# Patient Record
Sex: Female | Born: 1955 | Race: White | Hispanic: No | Smoking: Former smoker
Health system: Southern US, Community
[De-identification: ages and names within clinical notes are randomized; demographics above are authoritative.]

## PROBLEM LIST (undated history)

## (undated) DIAGNOSIS — I639 Cerebral infarction, unspecified: Secondary | ICD-10-CM

## (undated) DIAGNOSIS — G43909 Migraine, unspecified, not intractable, without status migrainosus: Secondary | ICD-10-CM

## (undated) DIAGNOSIS — G4700 Insomnia, unspecified: Secondary | ICD-10-CM

## (undated) HISTORY — PX: APPENDECTOMY: SHX54

## (undated) HISTORY — PX: FOOT SURGERY: SHX648

## (undated) HISTORY — DX: Insomnia, unspecified: G47.00

## (undated) HISTORY — PX: VESICOVAGINAL FISTULA CLOSURE W/ TAH: SUR271

## (undated) HISTORY — DX: Cerebral infarction, unspecified: I63.9

## (undated) HISTORY — DX: Migraine, unspecified, not intractable, without status migrainosus: G43.909

## (undated) HISTORY — PX: WISDOM TOOTH EXTRACTION: SHX21

---

## 2011-07-16 ENCOUNTER — Institutional Professional Consult (permissible substitution): Payer: Self-pay | Admitting: Internal Medicine

## 2011-07-17 ENCOUNTER — Other Ambulatory Visit: Payer: Self-pay | Admitting: Critical Care Medicine

## 2011-07-17 ENCOUNTER — Encounter: Payer: Self-pay | Admitting: Critical Care Medicine

## 2011-07-17 ENCOUNTER — Ambulatory Visit (INDEPENDENT_AMBULATORY_CARE_PROVIDER_SITE_OTHER): Payer: Medicare Other | Admitting: Critical Care Medicine

## 2011-07-17 ENCOUNTER — Ambulatory Visit (HOSPITAL_BASED_OUTPATIENT_CLINIC_OR_DEPARTMENT_OTHER)
Admission: RE | Admit: 2011-07-17 | Discharge: 2011-07-17 | Disposition: A | Discharge status: Home / Self Care | Payer: Medicare Other | Source: Ambulatory Visit | Attending: Critical Care Medicine | Admitting: Critical Care Medicine

## 2011-07-17 DIAGNOSIS — J45909 Unspecified asthma, uncomplicated: Secondary | ICD-10-CM

## 2011-07-17 DIAGNOSIS — R059 Cough, unspecified: Secondary | ICD-10-CM | POA: Insufficient documentation

## 2011-07-17 DIAGNOSIS — I6350 Cerebral infarction due to unspecified occlusion or stenosis of unspecified cerebral artery: Secondary | ICD-10-CM

## 2011-07-17 DIAGNOSIS — G43909 Migraine, unspecified, not intractable, without status migrainosus: Secondary | ICD-10-CM | POA: Insufficient documentation

## 2011-07-17 DIAGNOSIS — I639 Cerebral infarction, unspecified: Secondary | ICD-10-CM

## 2011-07-17 DIAGNOSIS — R0602 Shortness of breath: Secondary | ICD-10-CM | POA: Insufficient documentation

## 2011-07-17 DIAGNOSIS — R0789 Other chest pain: Secondary | ICD-10-CM | POA: Insufficient documentation

## 2011-07-17 DIAGNOSIS — R05 Cough: Secondary | ICD-10-CM | POA: Insufficient documentation

## 2011-07-17 MED ORDER — PREDNISONE 10 MG PO TABS: ORAL_TABLET | ORAL | Status: DC

## 2011-07-17 MED ORDER — BUDESONIDE-FORMOTEROL FUMARATE 160-4.5 MCG/ACT IN AERO: 2.0000 | INHALATION_SPRAY | Freq: Two times a day (BID) | RESPIRATORY_TRACT | Status: DC

## 2011-07-17 NOTE — Patient Instructions (Addendum)
Stay off advair Start symbicort two puff twice daily Use combivent or nebulizer medication every 4-6 hours AS NEEDED, NOT ON A SCHEDULE Prednisone 10mg  Take 4 for three days 3 for three days 2 for three days 1 for three days and stop Allergy labs today Chest xray today Alpha one antitrypsin assay today Return 1 week 07/22/11  for recheck with NP Tammy Parrett, then Dr Delford Field in 6 weeks

## 2011-07-17 NOTE — Assessment & Plan Note (Signed)
Severe persistent asthma , ? Component of Copd with prior smoking ?emphysema Note CXR shows no active disease PFTs show moderate obstruction Plan Stay off advair Start symbicort two puff twice daily Use combivent or nebulizer medication every 4-6 hours AS NEEDED, NOT ON A SCHEDULE Prednisone 10mg  Take 4 for three days 3 for three days 2 for three days 1 for three days and stop Allergy labs today Check alpha one anti trypsin assay
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
## 2011-07-17 NOTE — Progress Notes (Signed)
Quick Note:  Notify the patient that the Xray is stable and no pneumonia No change in medications are recommended. Continue current meds as prescribed at last office visit ______ 

## 2011-07-18 ENCOUNTER — Telehealth: Payer: Self-pay | Admitting: Critical Care Medicine

## 2011-07-18 LAB — ALLERGY FULL PROFILE
Allergen, D pternoyssinus,d7: <0.1 kU/L (ref <0.35)
Allergen,Goose feathers, e70: <0.1 kU/L (ref <0.35)
Box Elder IgE: <0.1 kU/L (ref <0.35)
Candida Albicans: <0.1 kU/L (ref <0.35)
Common Ragweed: <0.1 kU/L (ref <0.35)
Curvularia lunata: <0.1 kU/L (ref <0.35)
Fescue: <0.1 kU/L (ref <0.35)
G005 Rye, Perennial: <0.1 kU/L (ref <0.35)
Goldenrod: <0.1 kU/L (ref <0.35)
House Dust Hollister: <0.1 kU/L (ref <0.35)
IgE (Immunoglobulin E), Serum: 9.6 IU/mL (ref 0.0–180.0)
Oak: <0.1 kU/L (ref <0.35)
Stemphylium Botryosum: <0.1 kU/L (ref <0.35)
Timothy Grass: <0.1 kU/L (ref <0.35)

## 2011-07-18 NOTE — Progress Notes (Signed)
Quick Note:  Call pt and tell her labs are ok, No change in medications No allergies seen ______

## 2011-07-18 NOTE — Progress Notes (Signed)
Quick Note:  lmomtcb ______ 

## 2011-07-18 NOTE — Telephone Encounter (Signed)
lmomtcb x1 

## 2011-07-21 NOTE — Telephone Encounter (Signed)
Pt states her nebulizer medication is albuterol-ipratropium and she uses it 4 times a day as needed. Carron Curie, CMA

## 2011-07-21 NOTE — Progress Notes (Signed)
Quick Note:  Called, spoke with pt. I informed her xray is stable and no pna per PW. Advised he recs no change in meds - she should continue them as prescribed at last OV. She verbalized understanding of these results and recs and voiced no further questions/cocnerns at this time. ______

## 2011-07-21 NOTE — Telephone Encounter (Signed)
Medication already on pt's med list as qid prn.

## 2011-07-21 NOTE — Telephone Encounter (Signed)
Crystal Please go into pts chart and document this neb med in med list  pw

## 2011-07-21 NOTE — Progress Notes (Signed)
Quick Note:  Called, spoke with pt. I informed her labs are ok, no allergies seen per Dr. Delford Field and advised he recs no change in her meds. She verbalized understanding of these results and recs and voiced no further questions/cocnerns at this time. ______

## 2011-07-28 ENCOUNTER — Encounter: Payer: Self-pay | Admitting: Critical Care Medicine

## 2011-07-28 ENCOUNTER — Ambulatory Visit (INDEPENDENT_AMBULATORY_CARE_PROVIDER_SITE_OTHER): Payer: Medicare Other | Admitting: Critical Care Medicine

## 2011-07-28 DIAGNOSIS — J45909 Unspecified asthma, uncomplicated: Secondary | ICD-10-CM

## 2011-07-28 NOTE — Progress Notes (Signed)
Subjective:    Patient ID: Tiffany Mcclure, female    DOB: 09/22/1955, 56 y.o.   MRN: [id number]  HPI 07/28/2011 No real change.  Has a lesion on the lip.  No smoking.  Still has pndrip. Still with dry cough.  Notes some wheezing if walking.  Pt cont to use the symbicort. HFA technique poor   Past Medical History  Diagnosis Date  . Asthma   . Migraines   . Insomnia   . Stroke      Family History  Problem Relation Age of Onset  . Emphysema Father   . Emphysema Brother   . Emphysema Paternal Grandmother   . Emphysema Paternal Grandfather   . Asthma Father   . Heart attack Mother   . Heart attack Father   . Heart attack Brother   . Heart disease Father   . Cancer      maternal great grandmother, type unknown  . Cancer Paternal Aunt     type unknown     History   Social History  . Marital Status: Divorced    Spouse Name: N/A    Number of Children: 3  . Years of Education: N/A   Occupational History  . disability     cotton mill/sewing   Social History Main Topics  . Smoking status: Former Smoker -- 2.0 packs/day for 6 years    Types: Cigarettes    Quit date: 07/16/1996  . Smokeless tobacco: Never Used  . Alcohol Use: No  . Drug Use: No  . Sexually Active: Not on file   Other Topics Concern  . Not on file   Social History Narrative  . No narrative on file     No Known Allergies   Outpatient Prescriptions Prior to Visit  Medication Sig Dispense Refill  . ALPRAZolam (XANAX) 0.5 MG tablet Take 0.5 mg by mouth. Take 1/2 to 1 tablet twice a day as needed for anxiety      . benzonatate (TESSALON) 200 MG capsule Take 200 mg by mouth 2 (two) times daily as needed.      . budesonide-formoterol (SYMBICORT) 160-4.5 MCG/ACT inhaler Inhale 2 puffs into the lungs 2 (two) times daily.  1 Inhaler  12  . Cetirizine HCl 10 MG CAPS Take by mouth as needed.      . clopidogrel (PLAVIX) 75 MG tablet Take 75 mg by mouth daily.      . DULoxetine (CYMBALTA) 60 MG capsule Take  60 mg by mouth daily.      . nabumetone (RELAFEN) 750 MG tablet Take 750 mg by mouth 2 (two) times daily as needed.      Marland Kitchen oxyCODONE-acetaminophen (PERCOCET) 5-325 MG per tablet Take 1 tablet by mouth. Take 1 tablet 3 times a day as needed      . pantoprazole (PROTONIX) 40 MG tablet Take 40 mg by mouth daily.      . rosuvastatin (CRESTOR) 20 MG tablet Take 20 mg by mouth daily.      Marland Kitchen topiramate (TOPAMAX) 200 MG tablet Take 200 mg by mouth 2 (two) times daily.      Marland Kitchen zolpidem (AMBIEN) 10 MG tablet Take 10 mg by mouth at bedtime as needed.      Marland Kitchen ipratropium-albuterol (DUONEB) 0.5-2.5 (3) MG/3ML SOLN Take 3 mLs by nebulization every 4 (four) hours as needed.      Marland Kitchen albuterol (PROVENTIL) (2.5 MG/3ML) 0.083% nebulizer solution Take 2.5 mg by nebulization every 4 (four) hours as needed.      Marland Kitchen  albuterol-ipratropium (COMBIVENT) 18-103 MCG/ACT inhaler Inhale 2 puffs into the lungs every 6 (six) hours as needed.      . predniSONE (DELTASONE) 10 MG tablet Take 4 for three days 3 for three days 2 for three days 1 for three days and stop  30 tablet  0      Review of Systems  Constitutional: Negative for chills, diaphoresis, activity change, fatigue and unexpected weight change.  HENT: Positive for mouth sores and neck pain. Negative for hearing loss, nosebleeds, congestion, facial swelling, neck stiffness, dental problem, voice change, sinus pressure, tinnitus and ear discharge.   Eyes: Negative for photophobia, discharge, itching and visual disturbance.  Respiratory: Negative for apnea, choking, chest tightness and stridor.   Cardiovascular: Negative for palpitations and leg swelling.  Gastrointestinal: Negative for nausea, vomiting, abdominal pain, constipation, blood in stool and abdominal distention.  Genitourinary: Negative for dysuria, urgency, frequency, hematuria, flank pain, decreased urine volume and difficulty urinating.  Musculoskeletal: Positive for back pain. Negative for joint swelling,  arthralgias and gait problem.  Skin: Negative for color change, pallor and rash.  Neurological: Negative for dizziness, tremors, seizures, syncope, speech difficulty, weakness, light-headedness and numbness.  Hematological: Negative for adenopathy. Bruises/bleeds easily.  Psychiatric/Behavioral: Positive for sleep disturbance. Negative for confusion and agitation. The patient is nervous/anxious.        Objective:   Physical Exam   Filed Vitals:   07/28/11 0919  BP: 118/80  Pulse: 63  Temp: 97.5 F (36.4 C)  TempSrc: Oral  Height: 5\' 3"  (1.6 m)  Weight: 143 lb (64.864 kg)  SpO2: 100%    Gen: Pleasant, well-nourished, in no distress,  normal affect  ENT: No lesions,  mouth clear,  oropharynx clear, no postnasal drip  Neck: No JVD, no TMG, no carotid bruits  Lungs: No use of accessory muscles, no dullness to percussion, distant BS  Cardiovascular: RRR, heart sounds normal, no murmur or gallops, no peripheral edema  Abdomen: soft and NT, no HSM,  BS normal  Musculoskeletal: No deformities, no cyanosis or clubbing  Neuro: alert, non focal  Skin: Warm, no lesions or rashes  Cleda Daub FeV1 70%  07/17/11      Assessment & Plan:   Asthma Severe persistent asthma , note poor HFA technique, pt retrained to up to 50% efficiency Note CXR shows no active disease PFTs show moderate obstruction Plan Cont symbicort two puff twice daily, work on [employer] or nebulizer medication every 4-6 hours AS NEEDED, NOT ON A SCHEDULE F/u alpha one level when available      Updated Medication List Outpatient Encounter Prescriptions as of 07/28/2011  Medication Sig Dispense Refill  . ALPRAZolam (XANAX) 0.5 MG tablet Take 0.5 mg by mouth. Take 1/2 to 1 tablet twice a day as needed for anxiety      . benzonatate (TESSALON) 200 MG capsule Take 200 mg by mouth 2 (two) times daily as needed.      . budesonide-formoterol (SYMBICORT) 160-4.5 MCG/ACT inhaler Inhale 2 puffs into  the lungs 2 (two) times daily.  1 Inhaler  12  . Cetirizine HCl 10 MG CAPS Take by mouth as needed.      . clopidogrel (PLAVIX) 75 MG tablet Take 75 mg by mouth daily.      . DULoxetine (CYMBALTA) 60 MG capsule Take 60 mg by mouth daily.      . nabumetone (RELAFEN) 750 MG tablet Take 750 mg by mouth 2 (two) times daily as needed.      Marland Kitchen  oxyCODONE-acetaminophen (PERCOCET) 5-325 MG per tablet Take 1 tablet by mouth. Take 1 tablet 3 times a day as needed      . pantoprazole (PROTONIX) 40 MG tablet Take 40 mg by mouth daily.      . rosuvastatin (CRESTOR) 20 MG tablet Take 20 mg by mouth daily.      Marland Kitchen topiramate (TOPAMAX) 200 MG tablet Take 200 mg by mouth 2 (two) times daily.      Marland Kitchen zolpidem (AMBIEN) 10 MG tablet Take 10 mg by mouth at bedtime as needed.      Marland Kitchen DISCONTD: ipratropium-albuterol (DUONEB) 0.5-2.5 (3) MG/3ML SOLN Take 3 mLs by nebulization every 4 (four) hours as needed.      Marland Kitchen albuterol (PROVENTIL) (2.5 MG/3ML) 0.083% nebulizer solution Take 2.5 mg by nebulization every 4 (four) hours as needed.      Marland Kitchen albuterol-ipratropium (COMBIVENT) 18-103 MCG/ACT inhaler Inhale 2 puffs into the lungs every 6 (six) hours as needed.      Marland Kitchen DISCONTD: predniSONE (DELTASONE) 10 MG tablet Take 4 for three days 3 for three days 2 for three days 1 for three days and stop  30 tablet  0

## 2011-07-28 NOTE — Patient Instructions (Signed)
Work on inhaler technique No medication changes Return one month

## 2011-07-28 NOTE — Assessment & Plan Note (Addendum)
Severe persistent asthma , note poor HFA technique, pt retrained to up to 50% efficiency Note CXR shows no active disease PFTs show moderate obstruction Plan Cont symbicort two puff twice daily, work on [employer] or nebulizer medication every 4-6 hours AS NEEDED, NOT ON A SCHEDULE F/u alpha one level when available

## 2011-08-07 ENCOUNTER — Telehealth: Payer: Self-pay | Admitting: *Deleted

## 2011-08-07 NOTE — Telephone Encounter (Signed)
Called, spoke with pt.  I informed her alpha 1 test results are normal.  She verbalized understanding of this and voiced no further questions/concerns at this time.

## 2011-08-14 ENCOUNTER — Encounter: Payer: Self-pay | Admitting: Critical Care Medicine

## 2011-08-28 ENCOUNTER — Ambulatory Visit: Payer: Medicare Other | Admitting: Critical Care Medicine

## 2011-09-18 ENCOUNTER — Ambulatory Visit (INDEPENDENT_AMBULATORY_CARE_PROVIDER_SITE_OTHER): Payer: Medicare Other | Admitting: Critical Care Medicine

## 2011-09-18 ENCOUNTER — Encounter: Payer: Self-pay | Admitting: Critical Care Medicine

## 2011-09-18 DIAGNOSIS — J45909 Unspecified asthma, uncomplicated: Secondary | ICD-10-CM

## 2011-09-18 NOTE — Progress Notes (Signed)
Subjective:    Patient ID: Tiffany Mcclure, female    DOB: May 11, 1956, 56 y.o.   MRN: [id number]  HPI  07/28/2011 No real change.  Has a lesion on the lip.  No smoking.  Still has pndrip. Still with dry cough.  Notes some wheezing if walking.  Pt cont to use the symbicort. HFA technique poor   09/18/2011 At last ov we rec Cont symbicort two puff twice daily, work on [employer] or nebulizer medication every 4-6 hours AS NEEDED, NOT ON A SCHEDULE F/u alpha one level when available  09/18/2011 Larey Seat last week and injured shoulder.  Now no real cough.  Wheezing is better.  On symbicort HFA and technique is better.  Facial issue is better. A1AT normal.    Past Medical History  Diagnosis Date  . Asthma   . Migraines   . Insomnia   . Stroke      Family History  Problem Relation Age of Onset  . Emphysema Father   . Emphysema Brother   . Emphysema Paternal Grandmother   . Emphysema Paternal Grandfather   . Asthma Father   . Heart attack Mother   . Heart attack Father   . Heart attack Brother   . Heart disease Father   . Cancer      maternal great grandmother, type unknown  . Cancer Paternal Aunt     type unknown     History   Social History  . Marital Status: Divorced    Spouse Name: N/A    Number of Children: 3  . Years of Education: N/A   Occupational History  . disability     cotton mill/sewing   Social History Main Topics  . Smoking status: Former Smoker -- 2.0 packs/day for 6 years    Types: Cigarettes    Quit date: 07/16/1996  . Smokeless tobacco: Never Used  . Alcohol Use: No  . Drug Use: No  . Sexually Active: Not on file   Other Topics Concern  . Not on file   Social History Narrative  . No narrative on file     No Known Allergies   Outpatient Prescriptions Prior to Visit  Medication Sig Dispense Refill  . albuterol (PROVENTIL) (2.5 MG/3ML) 0.083% nebulizer solution Take 2.5 mg by nebulization every 4 (four) hours as needed.        Marland Kitchen albuterol-ipratropium (COMBIVENT) 18-103 MCG/ACT inhaler Inhale 2 puffs into the lungs every 6 (six) hours as needed.      . ALPRAZolam (XANAX) 0.5 MG tablet Take 0.5 mg by mouth. Take 1/2 to 1 tablet twice a day as needed for anxiety      . benzonatate (TESSALON) 200 MG capsule Take 200 mg by mouth 2 (two) times daily as needed.      . budesonide-formoterol (SYMBICORT) 160-4.5 MCG/ACT inhaler Inhale 2 puffs into the lungs 2 (two) times daily.  1 Inhaler  12  . Cetirizine HCl 10 MG CAPS Take by mouth as needed.      . clopidogrel (PLAVIX) 75 MG tablet Take 75 mg by mouth daily.      . DULoxetine (CYMBALTA) 60 MG capsule Take 60 mg by mouth daily.      . nabumetone (RELAFEN) 750 MG tablet Take 750 mg by mouth 2 (two) times daily as needed.      Marland Kitchen oxyCODONE-acetaminophen (PERCOCET) 5-325 MG per tablet Take 1 tablet by mouth. Take 1 tablet 3 times a day as needed      .  pantoprazole (PROTONIX) 40 MG tablet Take 40 mg by mouth daily.      . rosuvastatin (CRESTOR) 20 MG tablet Take 20 mg by mouth daily.      Marland Kitchen topiramate (TOPAMAX) 200 MG tablet Take 200 mg by mouth 2 (two) times daily.      Marland Kitchen zolpidem (AMBIEN) 10 MG tablet Take 10 mg by mouth at bedtime as needed.          Review of Systems  Constitutional: Negative for chills, diaphoresis, activity change, fatigue and unexpected weight change.  HENT: Positive for mouth sores and neck pain. Negative for hearing loss, nosebleeds, congestion, facial swelling, neck stiffness, dental problem, voice change, sinus pressure, tinnitus and ear discharge.   Eyes: Negative for photophobia, discharge, itching and visual disturbance.  Respiratory: Negative for apnea, choking, chest tightness and stridor.   Cardiovascular: Negative for palpitations and leg swelling.  Gastrointestinal: Negative for nausea, vomiting, abdominal pain, constipation, blood in stool and abdominal distention.  Genitourinary: Negative for dysuria, urgency, frequency, hematuria,  flank pain, decreased urine volume and difficulty urinating.  Musculoskeletal: Positive for back pain. Negative for joint swelling, arthralgias and gait problem.  Skin: Negative for color change, pallor and rash.  Neurological: Negative for dizziness, tremors, seizures, syncope, speech difficulty, weakness, light-headedness and numbness.  Hematological: Negative for adenopathy. Bruises/bleeds easily.  Psychiatric/Behavioral: Positive for sleep disturbance. Negative for confusion and agitation. The patient is nervous/anxious.        Objective:   Physical Exam   Filed Vitals:   09/18/11 1336  BP: 126/76  Pulse: 72  Temp: 97.7 F (36.5 C)  TempSrc: Oral  Height: 5\' 3"  (1.6 m)  Weight: 144 lb 8 oz (65.545 kg)  SpO2: 100%    Gen: Pleasant, well-nourished, in no distress,  normal affect  ENT: No lesions,  mouth clear,  oropharynx clear, no postnasal drip  Neck: No JVD, no TMG, no carotid bruits  Lungs: No use of accessory muscles, no dullness to percussion, distant BS  Cardiovascular: RRR, heart sounds normal, no murmur or gallops, no peripheral edema  Abdomen: soft and NT, no HSM,  BS normal  Musculoskeletal: No deformities, no cyanosis or clubbing  Neuro: alert, non focal  Skin: Warm, no lesions or rashes        Assessment & Plan:   Asthma Moderate persistent asthma with improvement following Symbicort therapy Note normal alpha 1 antitrypsin assay and no evidence of emphysema on chest x-ray Plan  Maintain Symbicort 2 inhalations twice daily Continue antihistamine as needed Return 6 months     Updated Medication List Outpatient Encounter Prescriptions as of 09/18/2011  Medication Sig Dispense Refill  . albuterol (PROVENTIL) (2.5 MG/3ML) 0.083% nebulizer solution Take 2.5 mg by nebulization every 4 (four) hours as needed.      Marland Kitchen albuterol-ipratropium (COMBIVENT) 18-103 MCG/ACT inhaler Inhale 2 puffs into the lungs every 6 (six) hours as needed.      .  ALPRAZolam (XANAX) 0.5 MG tablet Take 0.5 mg by mouth. Take 1/2 to 1 tablet twice a day as needed for anxiety      . benzonatate (TESSALON) 200 MG capsule Take 200 mg by mouth 2 (two) times daily as needed.      . budesonide-formoterol (SYMBICORT) 160-4.5 MCG/ACT inhaler Inhale 2 puffs into the lungs 2 (two) times daily.  1 Inhaler  12  . Cetirizine HCl 10 MG CAPS Take by mouth as needed.      . clopidogrel (PLAVIX) 75 MG tablet Take 75 mg by  mouth daily.      . DULoxetine (CYMBALTA) 60 MG capsule Take 60 mg by mouth daily.      . nabumetone (RELAFEN) 750 MG tablet Take 750 mg by mouth 2 (two) times daily as needed.      Marland Kitchen oxyCODONE-acetaminophen (PERCOCET) 5-325 MG per tablet Take 1 tablet by mouth. Take 1 tablet 3 times a day as needed      . pantoprazole (PROTONIX) 40 MG tablet Take 40 mg by mouth daily.      . rosuvastatin (CRESTOR) 20 MG tablet Take 20 mg by mouth daily.      Marland Kitchen topiramate (TOPAMAX) 200 MG tablet Take 200 mg by mouth 2 (two) times daily.      Marland Kitchen zolpidem (AMBIEN) 10 MG tablet Take 10 mg by mouth at bedtime as needed.

## 2011-09-18 NOTE — Patient Instructions (Signed)
Stay on symbicort two puff twice daily Return 6 months  

## 2011-09-18 NOTE — Assessment & Plan Note (Addendum)
Moderate persistent asthma with improvement following Symbicort therapy Note normal alpha 1 antitrypsin assay and no evidence of emphysema on chest x-ray Plan  Maintain Symbicort 2 inhalations twice daily Continue antihistamine as needed Return 6 months

## 2012-03-25 ENCOUNTER — Ambulatory Visit (INDEPENDENT_AMBULATORY_CARE_PROVIDER_SITE_OTHER): Payer: Medicare Other | Admitting: Critical Care Medicine

## 2012-03-25 ENCOUNTER — Encounter: Payer: Self-pay | Admitting: Critical Care Medicine

## 2012-03-25 ENCOUNTER — Ambulatory Visit: Payer: Medicare Other | Admitting: Critical Care Medicine

## 2012-03-25 VITALS — BP 128/60 | HR 64 | Temp 97.8°F | Ht 63.0 in | Wt 141.0 lb

## 2012-03-25 DIAGNOSIS — J45909 Unspecified asthma, uncomplicated: Secondary | ICD-10-CM

## 2012-03-25 MED ORDER — BUDESONIDE 180 MCG/ACT IN AEPB: INHALATION_SPRAY | RESPIRATORY_TRACT | Status: DC

## 2012-03-25 NOTE — Patient Instructions (Addendum)
Stop symbicort  Start Pulmicort two puff twice a day for 7days then reduce to two puff daily and stay Use combivent as needed Return 2 months

## 2012-03-25 NOTE — Progress Notes (Signed)
Subjective:    Patient ID: Tiffany Mcclure, female    DOB: 1956/03/22, 56 y.o.   MRN: [id number]  HPI  03/25/2012 SInce last ov is better.  No cough no wheeze.  Uses rescue inhaler only if around strong fumes/perfume.   Pt denies any significant sore throat, nasal congestion or excess secretions, fever, chills, sweats, unintended weight loss, pleurtic or exertional chest pain, orthopnea PND, or leg swelling Pt denies any increase in rescue therapy over baseline, denies waking up needing it or having any early am or nocturnal exacerbations of coughing/wheezing/or dyspnea. Pt also denies any obvious fluctuation in symptoms with  weather or environmental change or other alleviating or aggravating factors  Notes some pndrip   Past Medical History  Diagnosis Date  . Asthma   . Migraines   . Insomnia   . Stroke      Family History  Problem Relation Age of Onset  . Emphysema Father   . Emphysema Brother   . Emphysema Paternal Grandmother   . Emphysema Paternal Grandfather   . Asthma Father   . Heart attack Mother   . Heart attack Father   . Heart attack Brother   . Heart disease Father   . Cancer      maternal great grandmother, type unknown  . Cancer Paternal Aunt     type unknown     History   Social History  . Marital Status: Divorced    Spouse Name: N/A    Number of Children: 3  . Years of Education: N/A   Occupational History  . disability     cotton mill/sewing   Social History Main Topics  . Smoking status: Former Smoker -- 2.0 packs/day for 6 years    Types: Cigarettes    Quit date: 07/16/1996  . Smokeless tobacco: Never Used  . Alcohol Use: No  . Drug Use: No  . Sexually Active: Not on file   Other Topics Concern  . Not on file   Social History Narrative  . No narrative on file     No Known Allergies   Outpatient Prescriptions Prior to Visit  Medication Sig Dispense Refill  . albuterol (PROVENTIL) (2.5 MG/3ML) 0.083% nebulizer solution Take  2.5 mg by nebulization every 4 (four) hours as needed.      Marland Kitchen albuterol-ipratropium (COMBIVENT) 18-103 MCG/ACT inhaler Inhale 2 puffs into the lungs every 6 (six) hours as needed.      . ALPRAZolam (XANAX) 0.5 MG tablet Take 0.5 mg by mouth. Take 1/2 to 1 tablet twice a day as needed for anxiety      . Cetirizine HCl 10 MG CAPS Take 1 capsule by mouth daily as needed.       . clopidogrel (PLAVIX) 75 MG tablet Take 75 mg by mouth daily.      . DULoxetine (CYMBALTA) 60 MG capsule Take 60 mg by mouth daily.      . nabumetone (RELAFEN) 750 MG tablet Take 750 mg by mouth 2 (two) times daily.       Marland Kitchen oxyCODONE-acetaminophen (PERCOCET) 5-325 MG per tablet Take 1 tablet by mouth. Take 1 tablet 3 times a day as needed      . pantoprazole (PROTONIX) 40 MG tablet Take 40 mg by mouth daily.      . rosuvastatin (CRESTOR) 20 MG tablet Take 20 mg by mouth daily.      Marland Kitchen topiramate (TOPAMAX) 200 MG tablet Take 200 mg by mouth 2 (two) times daily.      Marland Kitchen  zolpidem (AMBIEN) 10 MG tablet Take 5 mg by mouth at bedtime.       . budesonide-formoterol (SYMBICORT) 160-4.5 MCG/ACT inhaler Inhale 2 puffs into the lungs 2 (two) times daily.  1 Inhaler  12  . benzonatate (TESSALON) 200 MG capsule Take 200 mg by mouth 2 (two) times daily as needed.          Review of Systems  Constitutional: Negative for chills, diaphoresis, activity change, fatigue and unexpected weight change.  HENT: Positive for mouth sores and neck pain. Negative for hearing loss, nosebleeds, congestion, facial swelling, neck stiffness, dental problem, voice change, sinus pressure, tinnitus and ear discharge.   Eyes: Negative for photophobia, discharge, itching and visual disturbance.  Respiratory: Negative for apnea, choking, chest tightness and stridor.   Cardiovascular: Negative for palpitations and leg swelling.  Gastrointestinal: Negative for nausea, vomiting, abdominal pain, constipation, blood in stool and abdominal distention.  Genitourinary:  Negative for dysuria, urgency, frequency, hematuria, flank pain, decreased urine volume and difficulty urinating.  Musculoskeletal: Positive for back pain. Negative for joint swelling, arthralgias and gait problem.  Skin: Negative for color change, pallor and rash.  Neurological: Negative for dizziness, tremors, seizures, syncope, speech difficulty, weakness, light-headedness and numbness.  Hematological: Negative for adenopathy. Bruises/bleeds easily.  Psychiatric/Behavioral: Positive for sleep disturbance. Negative for confusion and agitation. The patient is nervous/anxious.        Objective:   Physical Exam   Filed Vitals:   03/25/12 1404  BP: 128/60  Pulse: 64  Temp: 97.8 F (36.6 C)  TempSrc: Oral  Height: 5\' 3"  (1.6 m)  Weight: 141 lb (63.957 kg)  SpO2: 100%    Gen: Pleasant, well-nourished, in no distress,  normal affect  ENT: No lesions,  mouth clear,  oropharynx clear, no postnasal drip  Neck: No JVD, no TMG, no carotid bruits  Lungs: No use of accessory muscles, no dullness to percussion, distant BS  Cardiovascular: RRR, heart sounds normal, no murmur or gallops, no peripheral edema  Abdomen: soft and NT, no HSM,  BS normal  Musculoskeletal: No deformities, no cyanosis or clubbing  Neuro: alert, non focal  Skin: Warm, no lesions or rashes        Assessment & Plan:   Moderate persistent asthma Moderate persistent asthma with stable on Laba/ics Plan Trial off laba/ics  Start ICS alone: pulmicort two puff bid x 7days then two puff daily thereafter    Updated Medication List Outpatient Encounter Prescriptions as of 03/25/2012  Medication Sig Dispense Refill  . albuterol (PROVENTIL) (2.5 MG/3ML) 0.083% nebulizer solution Take 2.5 mg by nebulization every 4 (four) hours as needed.      Marland Kitchen albuterol-ipratropium (COMBIVENT) 18-103 MCG/ACT inhaler Inhale 2 puffs into the lungs every 6 (six) hours as needed.      . ALPRAZolam (XANAX) 0.5 MG tablet Take 0.5  mg by mouth. Take 1/2 to 1 tablet twice a day as needed for anxiety      . Cetirizine HCl 10 MG CAPS Take 1 capsule by mouth daily as needed.       . clopidogrel (PLAVIX) 75 MG tablet Take 75 mg by mouth daily.      . DULoxetine (CYMBALTA) 60 MG capsule Take 60 mg by mouth daily.      . nabumetone (RELAFEN) 750 MG tablet Take 750 mg by mouth 2 (two) times daily.       Marland Kitchen oxyCODONE-acetaminophen (PERCOCET) 5-325 MG per tablet Take 1 tablet by mouth. Take 1 tablet 3 times a  day as needed      . pantoprazole (PROTONIX) 40 MG tablet Take 40 mg by mouth daily.      . rosuvastatin (CRESTOR) 20 MG tablet Take 20 mg by mouth daily.      Marland Kitchen topiramate (TOPAMAX) 200 MG tablet Take 200 mg by mouth 2 (two) times daily.      Marland Kitchen zolpidem (AMBIEN) 10 MG tablet Take 5 mg by mouth at bedtime.       . [DISCONTINUED] budesonide-formoterol (SYMBICORT) 160-4.5 MCG/ACT inhaler Inhale 2 puffs into the lungs 2 (two) times daily.  1 Inhaler  12  . [DISCONTINUED] budesonide-formoterol (SYMBICORT) 160-4.5 MCG/ACT inhaler Inhale 1 puff into the lungs daily.      . budesonide (PULMICORT FLEXHALER) 180 MCG/ACT inhaler Two puff twice a day for 7 days then two puff daily  1 each  6  . [DISCONTINUED] benzonatate (TESSALON) 200 MG capsule Take 200 mg by mouth 2 (two) times daily as needed.

## 2012-03-25 NOTE — Assessment & Plan Note (Signed)
Moderate persistent asthma with stable on Laba/ics Plan Trial off laba/ics  Start ICS alone: pulmicort two puff bid x 7days then two puff daily thereafter

## 2012-03-26 ENCOUNTER — Telehealth: Payer: Self-pay | Admitting: Critical Care Medicine

## 2012-03-26 NOTE — Telephone Encounter (Signed)
Member ID # [id number]. Pul,icort Flexhaler APPROVED from 03/25/12 through 05/18/2013. Pharmacy notified.

## 2012-05-20 ENCOUNTER — Telehealth: Payer: Self-pay | Admitting: Critical Care Medicine

## 2012-05-20 NOTE — Telephone Encounter (Signed)
Called and spoke with pt on 05/20/12 to make next ov per recall.  Pt did not wish to make this appt at this time.  She stated she will call back and schedule @ a later date. Leanora Ivanoff

## 2013-01-16 IMAGING — CR DG CHEST 2V
2 series · 2 of 2 positions shown · non-contrast
Comparison: None.

CLINICAL DATA: Cough, shortness of breath and chest tightness.

CHEST - 2 VIEW

[w chest pa]
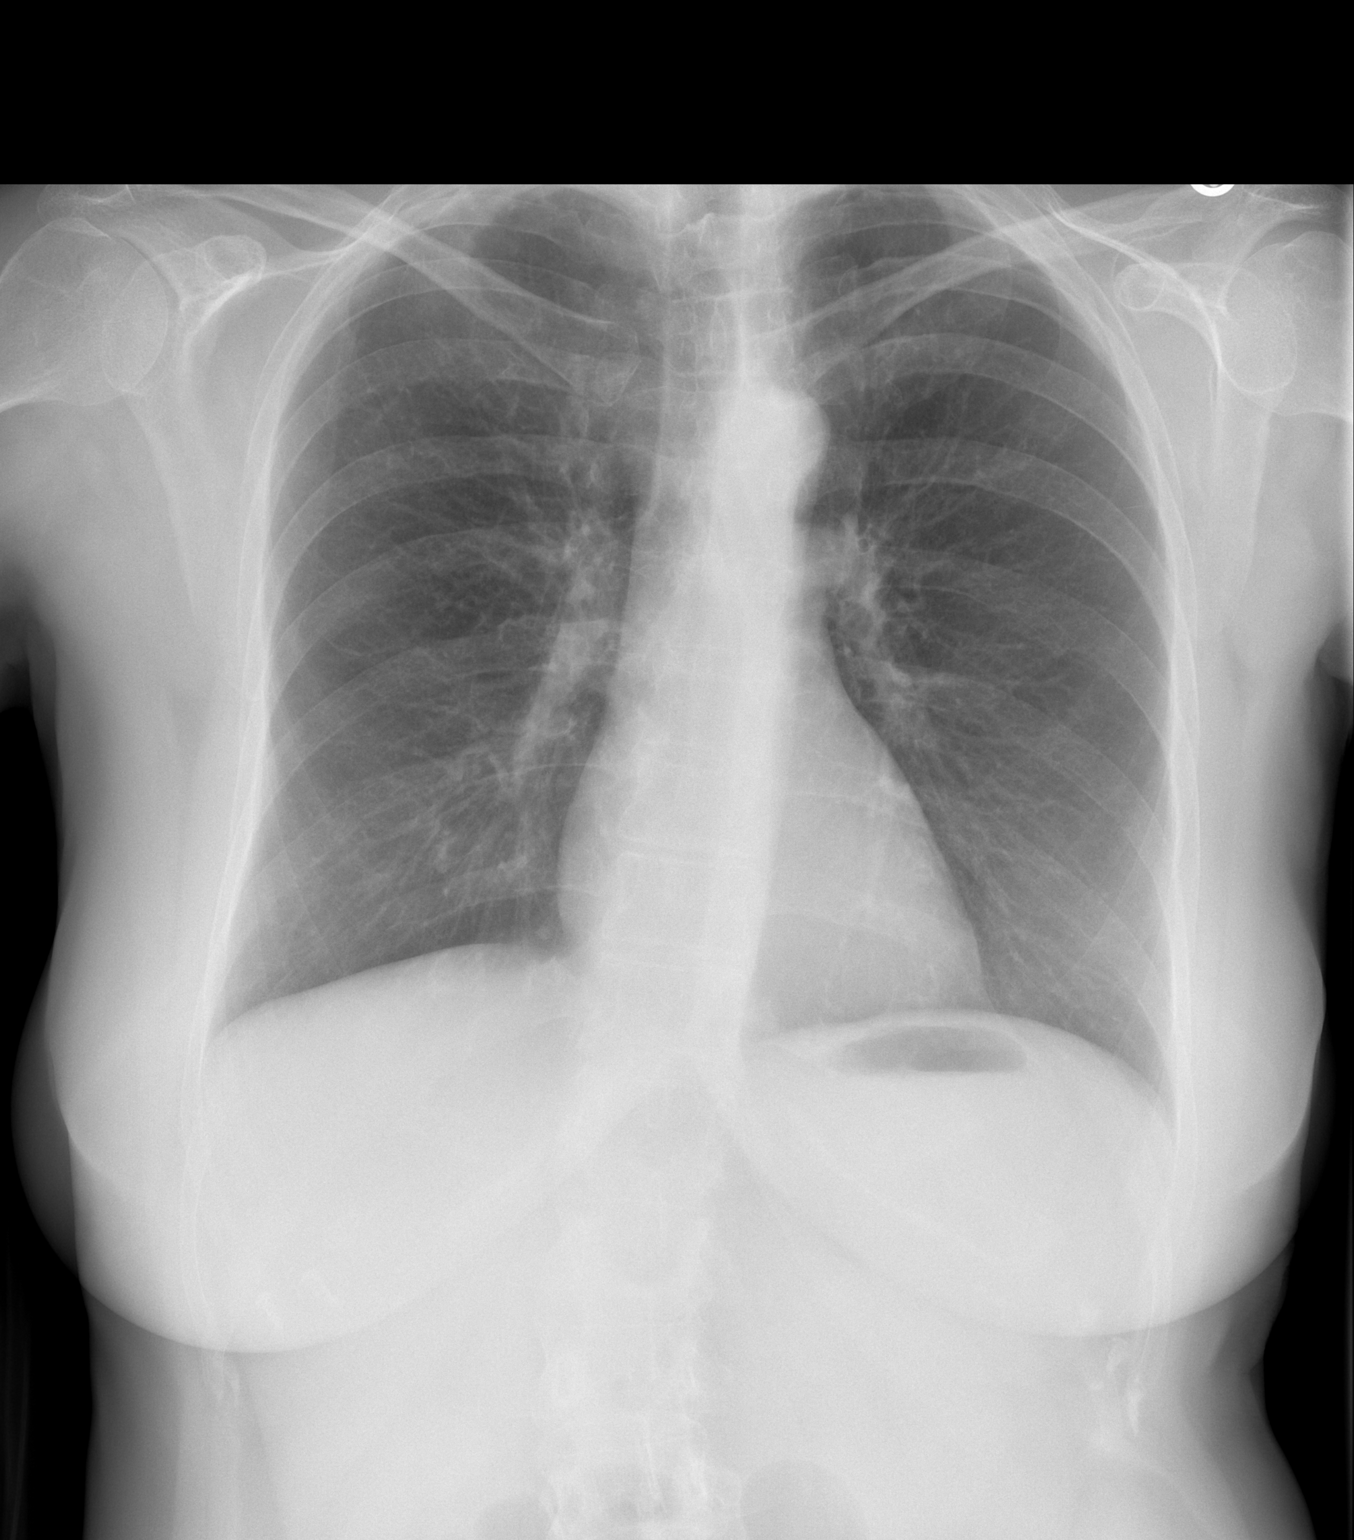

[w chest lat]
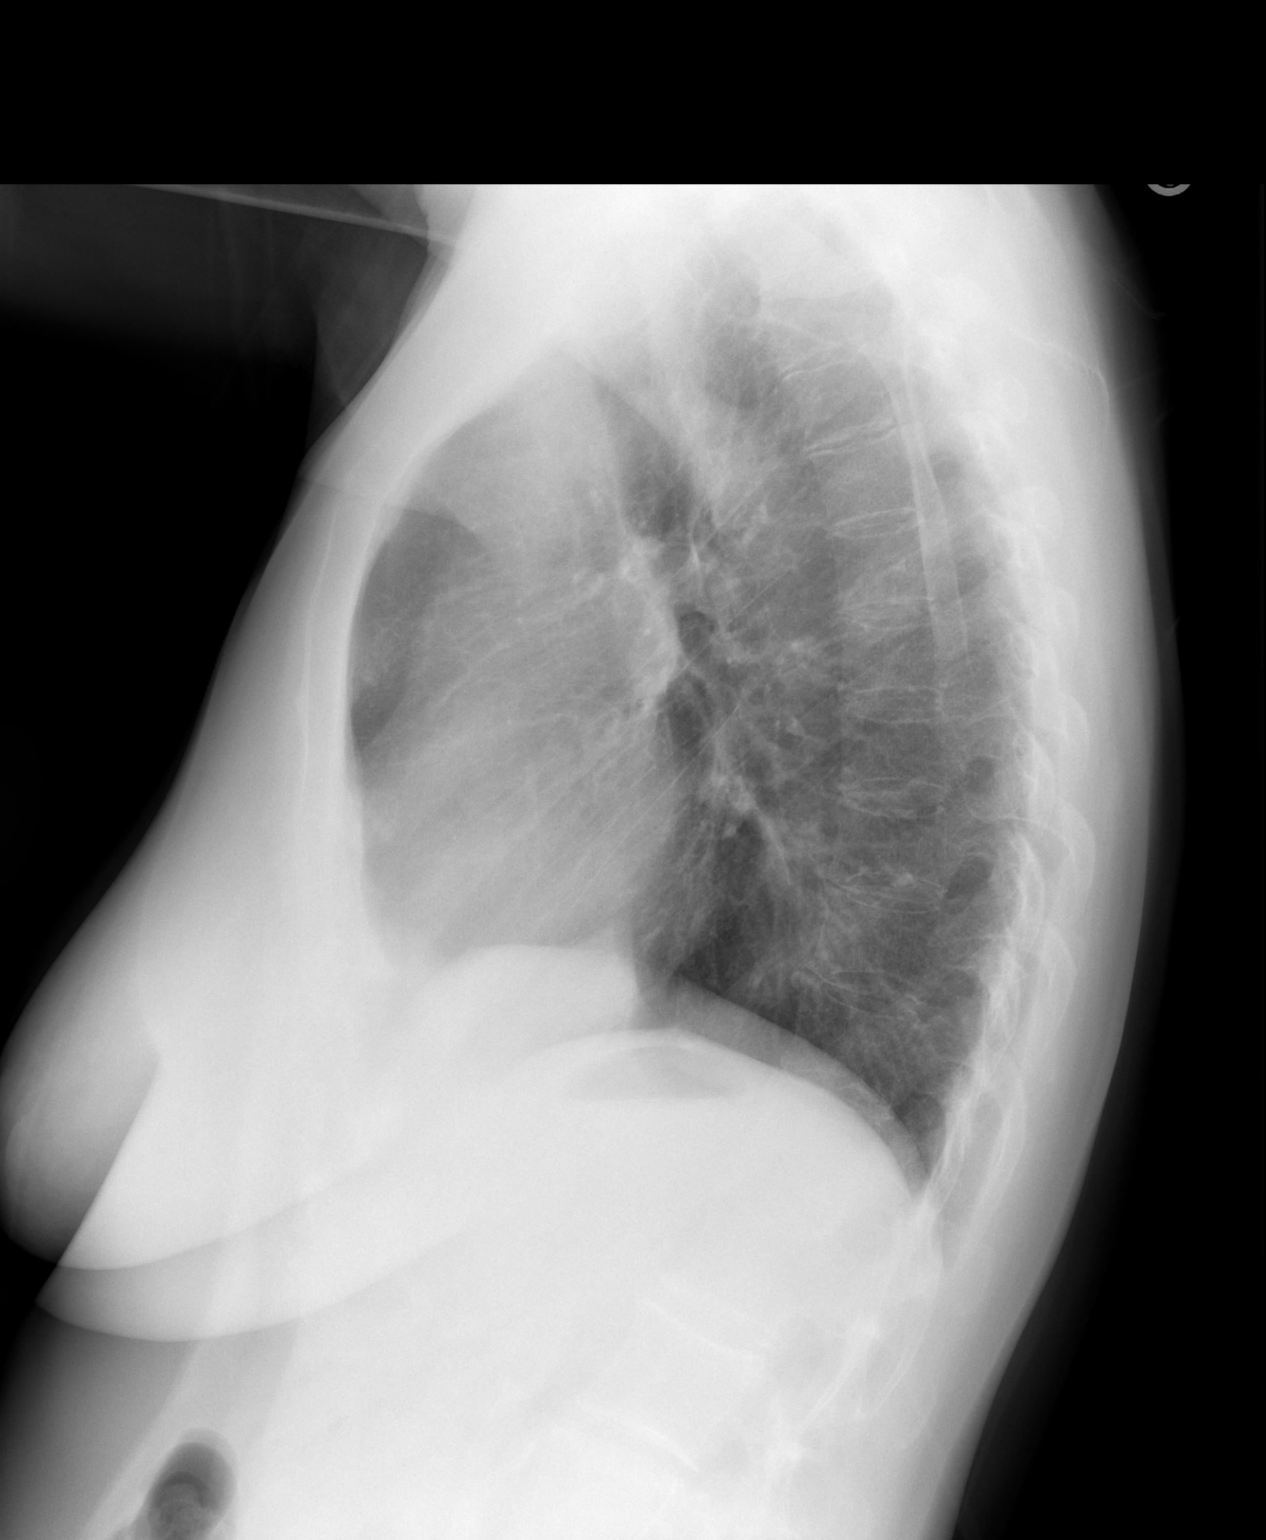

[2 of 2 positions shown; findings below may reference images not displayed]

FINDINGS: Trachea is midline.  Heart size normal.  Biapical pleural
parenchymal scarring.  Lungs are otherwise clear.  No pleural
fluid.
IMPRESSION: No acute findings.

## 2015-02-06 DIAGNOSIS — J45909 Unspecified asthma, uncomplicated: Secondary | ICD-10-CM | POA: Insufficient documentation

## 2015-02-06 DIAGNOSIS — K219 Gastro-esophageal reflux disease without esophagitis: Secondary | ICD-10-CM | POA: Insufficient documentation

## 2015-02-06 DIAGNOSIS — J309 Allergic rhinitis, unspecified: Secondary | ICD-10-CM | POA: Insufficient documentation

## 2015-03-26 ENCOUNTER — Other Ambulatory Visit: Payer: Self-pay | Admitting: *Deleted

## 2015-03-26 ENCOUNTER — Telehealth: Payer: Self-pay | Admitting: *Deleted

## 2015-03-26 MED ORDER — OMEPRAZOLE 40 MG PO CPDR: 40.0000 mg | DELAYED_RELEASE_CAPSULE | Freq: Two times a day (BID) | ORAL | Status: DC

## 2015-03-26 NOTE — Telephone Encounter (Signed)
PT CALLED REQUESTING PRESCRIPTION FOR OMEPRAZOLE 40MG .

## 2015-03-26 NOTE — Telephone Encounter (Signed)
RX SENT LM ADVISNG PT OF SAME

## 2015-06-20 ENCOUNTER — Other Ambulatory Visit: Payer: Self-pay | Admitting: *Deleted

## 2015-06-20 MED ORDER — BUDESONIDE-FORMOTEROL FUMARATE 160-4.5 MCG/ACT IN AERO: 2.0000 | INHALATION_SPRAY | Freq: Two times a day (BID) | RESPIRATORY_TRACT | Status: DC

## 2015-06-20 NOTE — Telephone Encounter (Signed)
CALLED PATIENT AND ADVISED THAT DULERA NOT ON PART D FORMULARY. WILL SEND [facility] DRUG TO [facility] PER DR KOZLOW RX SYMBICORT 160 2 BID SHE WILL TRY ON MONTHLY RX INSTEAD OF 90 DAY

## 2015-07-03 ENCOUNTER — Other Ambulatory Visit: Payer: Self-pay | Admitting: Allergy and Immunology

## 2015-07-03 NOTE — Telephone Encounter (Signed)
Please contact patient and find out what the situation is concerning her respiratory problems and her medications.

## 2015-07-03 NOTE — Telephone Encounter (Signed)
Please advise 

## 2015-07-03 NOTE — Telephone Encounter (Signed)
SYMBICORT is not working. Needs to try something else.  Tiffany Mcclure

## 2015-07-03 NOTE — Telephone Encounter (Signed)
Please have patient come in for an OV so we can try samples of a different medication.

## 2015-07-03 NOTE — Telephone Encounter (Signed)
Patient states that strong odors are causing her to have breathing problems. Patient also states when she is taking the dog for a walk and is going up an hill she has noticed she is getting short of breath and before she did not. Please advise.

## 2015-07-04 NOTE — Telephone Encounter (Signed)
Patient informed and scheduled to see Dr.Kozlow tomorrow, February 16, at 10:45 am.

## 2015-07-05 ENCOUNTER — Ambulatory Visit (INDEPENDENT_AMBULATORY_CARE_PROVIDER_SITE_OTHER): Payer: Medicare Other | Admitting: Allergy and Immunology

## 2015-07-05 ENCOUNTER — Encounter: Payer: Self-pay | Admitting: Allergy and Immunology

## 2015-07-05 VITALS — BP 122/80 | HR 60 | Resp 20 | Ht 62.0 in | Wt 145.5 lb

## 2015-07-05 DIAGNOSIS — J387 Other diseases of larynx: Secondary | ICD-10-CM | POA: Diagnosis not present

## 2015-07-05 DIAGNOSIS — H1010 Acute atopic conjunctivitis, unspecified eye: Secondary | ICD-10-CM | POA: Diagnosis not present

## 2015-07-05 DIAGNOSIS — J45901 Unspecified asthma with (acute) exacerbation: Secondary | ICD-10-CM | POA: Diagnosis not present

## 2015-07-05 DIAGNOSIS — J309 Allergic rhinitis, unspecified: Secondary | ICD-10-CM

## 2015-07-05 DIAGNOSIS — J441 Chronic obstructive pulmonary disease with (acute) exacerbation: Secondary | ICD-10-CM | POA: Diagnosis not present

## 2015-07-05 DIAGNOSIS — K219 Gastro-esophageal reflux disease without esophagitis: Secondary | ICD-10-CM

## 2015-07-05 MED ORDER — FLUTICASONE FUROATE-VILANTEROL 200-25 MCG/INH IN AEPB: 1.0000 | INHALATION_SPRAY | Freq: Every day | RESPIRATORY_TRACT | Status: DC

## 2015-07-05 MED ORDER — TIOTROPIUM BROMIDE MONOHYDRATE 1.25 MCG/ACT IN AERS: 2.0000 | INHALATION_SPRAY | Freq: Every day | RESPIRATORY_TRACT | Status: DC

## 2015-07-05 NOTE — Patient Instructions (Addendum)
  1. Breo 200 one inhalation one time per day  2. Spiriva 1.25 respimat two inhalations one time per day  2. Continue Nasonex 1-2 sprays each nostril daily  3. Continue omeprazole 40 mg twice a day  4. If needed: ProAir HFA 2 puffs every 4-6 hours  5. Stop Combivent and stop Symbicort  6. Return to clinic in 4 weeks or earlier if problem

## 2015-07-05 NOTE — Progress Notes (Signed)
Follow-up Note  Referring Provider: Lise Auer, MD Primary Provider: Lise Auer, MD Date of Office Visit: 07/05/2015  Subjective:   Tiffany Mcclure (DOB: 24-Jun-1955) is a 60 y.o. female [facility] in re-evaluation of the following:  HPI Comments: Tiffany Mcclure returns to this clinic stating that her Symbicort is not working. She does have a history of obstructive lung disease with a component of asthma for which she's been using Dulera with relatively good response until this January. At that point in time her insurance made her switch from [facility] to Symbicort and she is developed issues with being out of breath with exertion and a slight cough. She has not had any associated sputum production, chest pain, or any type of leg issues including calf pain. She has not had any high fevers. Her upper airway disease is under relatively good control without much therapy and her reflux is under very good control on omeprazole 40 mg twice a day.   Current Outpatient Prescriptions on File Prior to Visit  Medication Sig Dispense Refill  . albuterol (PROVENTIL) (2.5 MG/3ML) 0.083% nebulizer solution Take 2.5 mg by nebulization every 4 (four) hours as needed.    Marland Kitchen albuterol-ipratropium (COMBIVENT) 18-103 MCG/ACT inhaler Inhale 2 puffs into the lungs every 6 (six) hours as needed.    . ALPRAZolam (XANAX) 0.5 MG tablet Take 0.5 mg by mouth. Take 1/2 to 1 tablet twice a day as needed for anxiety    . budesonide-formoterol (SYMBICORT) 160-4.5 MCG/ACT inhaler Inhale 2 puffs into the lungs 2 (two) times daily. 1 Inhaler 5  . Cetirizine HCl 10 MG CAPS Take 1 capsule by mouth daily as needed.     . clopidogrel (PLAVIX) 75 MG tablet Take 75 mg by mouth daily.    . DULoxetine (CYMBALTA) 60 MG capsule Take 60 mg by mouth daily.    . mometasone (NASONEX) 50 MCG/ACT nasal spray Place 1 spray into the nose daily.    . nabumetone (RELAFEN) 750 MG tablet Take 750 mg  by mouth 2 (two) times daily.     Marland Kitchen omeprazole (PRILOSEC) 40 MG capsule Take 1 capsule (40 mg total) by mouth 2 (two) times daily. 60 capsule 3  . oxyCODONE-acetaminophen (PERCOCET) 5-325 MG per tablet Take 1 tablet by mouth. Take 1 tablet 3 times a day as needed    . rosuvastatin (CRESTOR) 20 MG tablet Take 20 mg by mouth daily.    Marland Kitchen topiramate (TOPAMAX) 200 MG tablet Take 200 mg by mouth 2 (two) times daily.    Marland Kitchen zolpidem (AMBIEN) 10 MG tablet Take 5 mg by mouth at bedtime.      No current facility-administered medications on file prior to visit.    Meds ordered this encounter  Medications  . fluticasone furoate-vilanterol (BREO ELLIPTA) 200-25 MCG/INH AEPB    Sig: Inhale 1 puff into the lungs daily. To prevent cough or wheeze    Dispense:  60 each    Refill:  5  . Tiotropium Bromide Monohydrate (SPIRIVA RESPIMAT) 1.25 MCG/ACT AERS    Sig: Inhale 2 Doses into the lungs daily.    Dispense:  4 g    Refill:  5    Past Medical History  Diagnosis Date  . Asthma   . Migraines   . Insomnia   . Stroke [facility])     Past Surgical History  Procedure Laterality Date  . Foot surgery      both  .  Vesicovaginal fistula closure w/ tah    . Appendectomy    . Wisdom tooth extraction      No Known Allergies  Review of systems negative except as noted in HPI / PMHx or noted below:  Review of Systems  Constitutional: Negative.   HENT: Negative.   Eyes: Negative.   Respiratory: Negative.   Cardiovascular: Negative.   Gastrointestinal: Negative.   Genitourinary: Negative.   Musculoskeletal: Negative.   Skin: Negative.   Neurological: Negative.   Endo/Heme/Allergies: Negative.   Psychiatric/Behavioral: Negative.      Objective:   Filed Vitals:   07/05/15 1052  BP: 122/80  Pulse: 60  Resp: 20   Height:  (157.5 cm)  Weight: 145 lb 8.1 oz (66 kg)   Physical Exam  Constitutional: She is well-developed, well-nourished, and in no distress.  HENT:  Head: Normocephalic.    Right Ear: Tympanic membrane, external ear and ear canal normal.  Left Ear: Tympanic membrane, external ear and ear canal normal.  Nose: Nose normal. No mucosal edema or rhinorrhea.  Mouth/Throat: Uvula is midline, oropharynx is clear and moist and mucous membranes are normal. No oropharyngeal exudate.  Eyes: Conjunctivae are normal.  Neck: Trachea normal. No tracheal tenderness present. No tracheal deviation present. No thyromegaly present.  Cardiovascular: Normal rate, regular rhythm, S1 normal, S2 normal and normal heart sounds.   No murmur heard. Pulmonary/Chest: Breath sounds normal. No stridor. No respiratory distress. She has no wheezes. She has no rales.  Musculoskeletal: She exhibits no edema.  Lymphadenopathy:       Head (right side): No tonsillar adenopathy present.       Head (left side): No tonsillar adenopathy present.    She has no cervical adenopathy.    She has no axillary adenopathy.  Neurological: She is alert. Gait normal.  Skin: No rash noted. She is not diaphoretic. No erythema. Nails show no clubbing.  Psychiatric: Mood and affect normal.    Diagnostics:    Spirometry was performed and demonstrated an FEV1 of 1.38 at 59 % of predicted.  The patient had an Asthma Control Test with the following results:  .    Assessment and Plan:   1. Acute exacerbation of COPD with asthma (HCC)   2. Allergic rhinoconjunctivitis   3. LPRD (laryngopharyngeal reflux disease)     1. Breo 200 one inhalation one time per day  2. Spiriva 1.25 respimat two inhalations one time per day  2. Continue Nasonex 1-2 sprays each nostril daily  3. Continue omeprazole 40 mg twice a day  4. If needed: ProAir HFA 2 puffs every 4-6 hours  5. Stop Combivent and stop Symbicort  6. Return to clinic in 4 weeks or earlier if problem  I will assume that Tiffany Mcclure is having her respiratory tract symptoms and her depressed spirometry because Symbicort is not working adequately. Given her  previous history of tobacco abuse we will treat her with triple therapy utilizing a inhaled steroid, long-acting bronchodilator, and anticholinergic agent on a daily basis. She will also continue on therapy for allergic rhinitis and reflux as stated above. I would like to see her back in this clinic in 4 weeks or earlier if there is a problem. Certainly if she does not respond appropriately we need to expand our differential diagnosis to examine for other etiologic factors that may be contributing to her symptoms.  Tiffany Schimke, MD [facility]

## 2015-07-23 ENCOUNTER — Ambulatory Visit (INDEPENDENT_AMBULATORY_CARE_PROVIDER_SITE_OTHER): Payer: Medicare Other | Admitting: Allergy and Immunology

## 2015-07-23 ENCOUNTER — Encounter: Payer: Self-pay | Admitting: Allergy and Immunology

## 2015-07-23 VITALS — BP 110/70 | HR 100 | Resp 24

## 2015-07-23 DIAGNOSIS — J45901 Unspecified asthma with (acute) exacerbation: Secondary | ICD-10-CM | POA: Diagnosis not present

## 2015-07-23 DIAGNOSIS — J387 Other diseases of larynx: Secondary | ICD-10-CM | POA: Diagnosis not present

## 2015-07-23 DIAGNOSIS — H1010 Acute atopic conjunctivitis, unspecified eye: Secondary | ICD-10-CM | POA: Diagnosis not present

## 2015-07-23 DIAGNOSIS — J309 Allergic rhinitis, unspecified: Secondary | ICD-10-CM | POA: Diagnosis not present

## 2015-07-23 DIAGNOSIS — J441 Chronic obstructive pulmonary disease with (acute) exacerbation: Secondary | ICD-10-CM | POA: Diagnosis not present

## 2015-07-23 DIAGNOSIS — K219 Gastro-esophageal reflux disease without esophagitis: Secondary | ICD-10-CM

## 2015-07-23 NOTE — Progress Notes (Signed)
Follow-up Note  Referring Provider: Lise AuerKhan, Jaber A, MD Primary Provider: Galvin ProfferHAGUE, IMRAN P, MD Date of Office Visit: 07/23/2015  Subjective:   Tiffany Mcclure (DOB: 12-18-1955) is a 60 y.o. female who [facility] in re-evaluation of the following:  HPI Comments: [name] returns to this clinic in reevaluation of her breathing problems. She still has a little bit of cough but most disturbing to her is the fact that she gets out of breath when she exerts herself any large degree. She is about the same as she was before. She continues to use her Breo and her Spiriva consistently and occasionally must use a short acting bronchodilator. Her nose is not really been causing her any problem. Reflux is not been causing her any problem.   Outpatient Prescriptions Prior to Visit  Medication Sig Dispense Refill  . albuterol (PROVENTIL) (2.5 MG/3ML) 0.083% nebulizer solution Take 2.5 mg by nebulization every 4 (four) hours as needed.    . ALPRAZolam (XANAX) 0.5 MG tablet Take 0.5 mg by mouth. Take 1/2 to 1 tablet twice a day as needed for anxiety    . Cetirizine HCl 10 MG CAPS Take 1 capsule by mouth daily as needed.     . clopidogrel (PLAVIX) 75 MG tablet Take 75 mg by mouth daily.    . DULoxetine (CYMBALTA) 60 MG capsule Take 60 mg by mouth daily.    . fluticasone furoate-vilanterol (BREO ELLIPTA) 200-25 MCG/INH AEPB Inhale 1 puff into the lungs daily. To prevent cough or wheeze 60 each 5  . mometasone (NASONEX) 50 MCG/ACT nasal spray Place 1 spray into the nose daily.    . nabumetone (RELAFEN) 750 MG tablet Take 750 mg by mouth 2 (two) times daily.     Marland Kitchen. omeprazole (PRILOSEC) 40 MG capsule Take 1 capsule (40 mg total) by mouth 2 (two) times daily. 60 capsule 3  . oxyCODONE-acetaminophen (PERCOCET) 5-325 MG per tablet Take 1 tablet by mouth. Take 1 tablet 3 times a day as needed    . rosuvastatin (CRESTOR) 20 MG tablet Take 20 mg by mouth daily.    .  Tiotropium Bromide Monohydrate (SPIRIVA RESPIMAT) 1.25 MCG/ACT AERS Inhale 2 Doses into the lungs daily. 4 g 5  . topiramate (TOPAMAX) 200 MG tablet Take 200 mg by mouth 2 (two) times daily.    Marland Kitchen. zolpidem (AMBIEN) 10 MG tablet Take 5 mg by mouth at bedtime.     Marland Kitchen. albuterol-ipratropium (COMBIVENT) 18-103 MCG/ACT inhaler Inhale 2 puffs into the lungs every 6 (six) hours as needed. Reported on 07/23/2015    . budesonide-formoterol (SYMBICORT) 160-4.5 MCG/ACT inhaler Inhale 2 puffs into the lungs 2 (two) times daily. (Patient not taking: Reported on 07/23/2015) 1 Inhaler 5   No facility-administered medications prior to visit.    Past Medical History  Diagnosis Date  . Asthma   . Migraines   . Insomnia   . Stroke [facility](HCC)     Past Surgical History  Procedure Laterality Date  . Foot surgery      both  . Vesicovaginal fistula closure w/ tah    . Appendectomy    . Wisdom tooth extraction      No Known Allergies  Review of systems negative except as noted in HPI / PMHx or noted below:  Review of Systems  Constitutional: Negative.   HENT: Negative.   Eyes: Negative.   Respiratory: Negative.   Cardiovascular: Negative.   Gastrointestinal: Negative.  Genitourinary: Negative.   Musculoskeletal: Negative.   Skin: Negative.   Neurological: Negative.   Endo/Heme/Allergies: Negative.   Psychiatric/Behavioral: Negative.      Objective:   Filed Vitals:   07/23/15 1604  BP: 110/70  Pulse: 100  Resp: 24          Physical Exam  Constitutional: She is well-developed, well-nourished, and in no distress.  Slightly raspy  HENT:  Head: Normocephalic.  Right Ear: Tympanic membrane, external ear and ear canal normal.  Left Ear: Tympanic membrane, external ear and ear canal normal.  Nose: Nose normal. No mucosal edema or rhinorrhea.  Mouth/Throat: Uvula is midline, oropharynx is clear and moist and mucous membranes are normal. No oropharyngeal exudate.  Eyes: Conjunctivae are normal.    Neck: Trachea normal. No tracheal tenderness present. No tracheal deviation present. No thyromegaly present.  Cardiovascular: Normal rate, regular rhythm, S1 normal, S2 normal and normal heart sounds.   No murmur heard. Pulmonary/Chest: Breath sounds normal. No stridor. No respiratory distress. She has no wheezes. She has no rales.  Musculoskeletal: She exhibits no edema.  Lymphadenopathy:       Head (right side): No tonsillar adenopathy present.       Head (left side): No tonsillar adenopathy present.    She has no cervical adenopathy.    She has no axillary adenopathy.  Neurological: She is alert. Gait normal.  Skin: No rash noted. She is not diaphoretic. No erythema. Nails show no clubbing.  Psychiatric: Mood and affect normal.    Diagnostics:    Spirometry was performed and demonstrated an FEV1 of 1.54 at 66 % of predicted.  Oxygen saturation at rest on room air was 98%. Oxygen saturation at room air while exercising up and down the hall was 98%  The patient had an Asthma Control Test with the following results: ACT Total Score: 12.    Assessment and Plan:   1. Acute exacerbation of COPD with asthma (HCC)   2. Allergic rhinoconjunctivitis   3. LPRD (laryngopharyngeal reflux disease)     1. Continue Breo 200 one inhalation one time per day  2. Continue Spiriva 1.25 respimat two inhalations one time per day  3. Continue Nasonex 1-2 sprays each nostril daily  4. Continue omeprazole 40 mg twice a day  5. If needed: ProAir HFA 2 puffs every 4-6 hours  6. Get a 2D echo for shortness of breath  7. Get a chest X-Ray  8. Evaluation with [facility]  9. Return to clinic in 12 weeks or earlier if problem  I will have Chandni continue to use pre-oh and Spiriva and Nasonex and we'll going to evaluate her heart function to rule out the possibility that a defect may be contributing to some of her dyspnea on exertion. As well, we will obtain a chest x-ray. I will see if  we can get her evaluated with our pulmonary at some point although I need to look through the records that she may already had an evaluation with them back in 2014 of which this was a nondiagnostic evaluation. I'll contact her with the results of her echo once it is available for review and we'll see her back in this clinic in 12 weeks or earlier if there is a problem.  [name], MD  [facility]

## 2015-07-23 NOTE — Patient Instructions (Addendum)
   1. Continue Breo 200 one inhalation one time per day  2. Continue Spiriva 1.25 respimat two inhalations one time per day  3. Continue Nasonex 1-2 sprays each nostril daily  4. Continue omeprazole 40 mg twice a day  5. If needed: ProAir HFA 2 puffs every 4-6 hours  6. Get a 2D echo for shortness of breath  7. Get a chest X-Ray  8. Return to clinic in 12 weeks or earlier if problem

## 2015-08-02 ENCOUNTER — Ambulatory Visit: Payer: Self-pay | Admitting: Allergy and Immunology

## 2015-08-06 ENCOUNTER — Encounter: Payer: Self-pay | Admitting: *Deleted

## 2015-08-13 ENCOUNTER — Ambulatory Visit: Payer: Self-pay | Admitting: Allergy and Immunology

## 2015-09-04 ENCOUNTER — Other Ambulatory Visit: Payer: Self-pay

## 2015-09-04 MED ORDER — OMEPRAZOLE 40 MG PO CPDR: 40.0000 mg | DELAYED_RELEASE_CAPSULE | Freq: Two times a day (BID) | ORAL | Status: DC

## 2015-10-15 ENCOUNTER — Ambulatory Visit: Payer: Medicare Other | Admitting: Allergy and Immunology

## 2015-11-01 ENCOUNTER — Encounter: Payer: Self-pay | Admitting: Allergy and Immunology

## 2015-11-01 ENCOUNTER — Ambulatory Visit (INDEPENDENT_AMBULATORY_CARE_PROVIDER_SITE_OTHER): Payer: Medicare Other | Admitting: Allergy and Immunology

## 2015-11-01 VITALS — BP 110/68 | HR 64 | Resp 20

## 2015-11-01 DIAGNOSIS — J309 Allergic rhinitis, unspecified: Secondary | ICD-10-CM | POA: Diagnosis not present

## 2015-11-01 DIAGNOSIS — H1010 Acute atopic conjunctivitis, unspecified eye: Secondary | ICD-10-CM

## 2015-11-01 DIAGNOSIS — J45909 Unspecified asthma, uncomplicated: Secondary | ICD-10-CM

## 2015-11-01 DIAGNOSIS — J449 Chronic obstructive pulmonary disease, unspecified: Secondary | ICD-10-CM

## 2015-11-01 DIAGNOSIS — K219 Gastro-esophageal reflux disease without esophagitis: Secondary | ICD-10-CM

## 2015-11-01 DIAGNOSIS — B370 Candidal stomatitis: Secondary | ICD-10-CM

## 2015-11-01 DIAGNOSIS — J387 Other diseases of larynx: Secondary | ICD-10-CM | POA: Diagnosis not present

## 2015-11-01 MED ORDER — FLUTICASONE FUROATE-VILANTEROL 100-25 MCG/INH IN AEPB: 1.0000 | INHALATION_SPRAY | Freq: Every day | RESPIRATORY_TRACT | Status: DC

## 2015-11-01 MED ORDER — FLUCONAZOLE 150 MG PO TABS: 150.0000 mg | ORAL_TABLET | Freq: Every day | ORAL | Status: DC

## 2015-11-01 NOTE — Progress Notes (Signed)
Follow-up Note  Referring Provider: Shelbie AmmonsHaque, Imran P, MD Primary Provider: Galvin ProfferHAGUE, IMRAN P, MD Date of Office Visit: 11/01/2015  Subjective:   Tiffany Mcclure (DOB: [birth year]) is a 60 y.o. female who [facility]2015 in re-evaluation of the following:  HPI: [name] returns to this clinic in reevaluation of her COPD with asthma, allergic rhinitis, and reflux-induced respiratory disease. I have not seen her in his clinic since March 2017.  During the interval she has done very well. She feels as though she has less dyspnea on exertion and she has no coughing and wheezing and her requirement for a bronchodilator is about twice a week and she has not required a systemic steroid to treat her respiratory tract disease. She continues on a combination of Breo and Spiriva.  Her nose has been doing quite well and she has not required an antibiotic to treat an episode of sinusitis. She continues on Nasonex.  Her reflux is doing pretty well while using her proton pump inhibitor twice a day and she is very careful about what she eats.    Medication List           albuterol (2.5 MG/3ML) 0.083% nebulizer solution  Commonly known as:  PROVENTIL  Take 2.5 mg by nebulization every 4 (four) hours as needed.     ALPRAZolam 0.5 MG tablet  Commonly known as:  XANAX  Take 0.5 mg by mouth. Take 1/2 to 1 tablet twice a day as needed for anxiety     Cetirizine HCl 10 MG Caps  Take 1 capsule by mouth daily as needed.     clopidogrel 75 MG tablet  Commonly known as:  PLAVIX  Take 75 mg by mouth daily.     DULoxetine 60 MG capsule  Commonly known as:  CYMBALTA  Take 60 mg by mouth daily.     fluticasone furoate-vilanterol 200-25 MCG/INH Aepb  Commonly known as:  BREO ELLIPTA  Inhale 1 puff into the lungs daily. To prevent cough or wheeze     mometasone 50 MCG/ACT nasal spray  Commonly known as:  NASONEX  Place 1 spray into the nose daily.     omeprazole 40 MG capsule  Commonly known as:  PRILOSEC  Take 1 capsule (40 mg total) by mouth 2 (two) times daily.     oxyCODONE-acetaminophen 5-325 MG tablet  Commonly known as:  PERCOCET/ROXICET  Take 1 tablet by mouth. Take 1 tablet 3 times a day as needed     rosuvastatin 20 MG tablet  Commonly known as:  CRESTOR  Take 20 mg by mouth daily.     Tiotropium Bromide Monohydrate 1.25 MCG/ACT Aers  Commonly known as:  SPIRIVA RESPIMAT  Inhale 2 Doses into the lungs daily.     topiramate 200 MG tablet  Commonly known as:  TOPAMAX  Take 200 mg by mouth 2 (two) times daily.     zolpidem 10 MG tablet  Commonly known as:  AMBIEN  Take 5 mg by mouth at bedtime.        Past Medical History  Diagnosis Date  . Asthma   . Migraines   . Insomnia   . Stroke [facility](HCC)     Past Surgical History  Procedure Laterality Date  . Foot surgery      both  . Vesicovaginal fistula closure w/ tah    . Appendectomy    . Wisdom tooth extraction      No Known  Allergies  Review of systems negative except as noted in HPI / PMHx or noted below:  Review of Systems  Constitutional: Negative.   HENT: Negative.   Eyes: Negative.   Respiratory: Negative.   Cardiovascular: Negative.   Gastrointestinal: Negative.   Genitourinary: Negative.   Musculoskeletal: Negative.   Skin: Negative.   Neurological: Negative.   Endo/Heme/Allergies: Negative.   Psychiatric/Behavioral: Negative.      Objective:   Filed Vitals:   11/01/15 1615  BP: 110/68  Pulse: 64  Resp: 20          Physical Exam  Constitutional: She is well-developed, well-nourished, and in no distress.  HENT:  Head: Normocephalic.  Right Ear: Tympanic membrane, external ear and ear canal normal.  Left Ear: Tympanic membrane, external ear and ear canal normal.  Nose: Nose normal. No mucosal edema or rhinorrhea.  Mouth/Throat: Uvula is midline, oropharynx is clear and moist and mucous membranes are normal. No oropharyngeal  exudate.  Thrush  Eyes: Conjunctivae are normal.  Neck: Trachea normal. No tracheal tenderness present. No tracheal deviation present. No thyromegaly present.  Cardiovascular: Normal rate, regular rhythm, S1 normal, S2 normal and normal heart sounds.   No murmur heard. Pulmonary/Chest: Breath sounds normal. No stridor. No respiratory distress. She has no wheezes. She has no rales.  Musculoskeletal: She exhibits no edema.  Lymphadenopathy:       Head (right side): No tonsillar adenopathy present.       Head (left side): No tonsillar adenopathy present.    She has no cervical adenopathy.  Neurological: She is alert. Gait normal.  Skin: No rash noted. She is not diaphoretic. No erythema. Nails show no clubbing.  Psychiatric: Mood and affect normal.    Diagnostics:    Spirometry was performed and demonstrated an FEV1 of 1.91 at 81 % of predicted.  The patient had an Asthma Control Test with the following results:  .    Assessment and Plan:   1. COPD with asthma (HCC)   2. Allergic rhinoconjunctivitis   3. LPRD (laryngopharyngeal reflux disease)   4. Thrush     1. Decrease Breo to Breo 100 one inhalation one time per day  2. Continue Spiriva HandiHaler one inhalations one time per day  3. Continue Nasonex 1-2 sprays each nostril daily  4. Continue omeprazole 40 mg twice a day  5. If needed: ProAir HFA 2 puffs every 4-6 hours  6. Diflucan 150 mg tablet single dose to treat thrush  7. Return to clinic in 6 months or earlier if problem  Lattie Corns has done relatively well although certainly she appears to have developed a problem with thrush while using her inhaled steroid. We'll see if she does as well as we lower her dose of inhaled steroid by 50% as noted above. I did give her a single dose of Diflucan to use to treat her fungal overgrowth. I will see her back in this clinic in 6 months or earlier if there is a problem. I've encouraged her to get the flu vaccine this  fall.  [name], MD [facility]

## 2015-11-01 NOTE — Patient Instructions (Addendum)
   1. Decrease Breo to Breo 100 one inhalation one time per day  2. Continue Spiriva HandiHaler one inhalations one time per day  3. Continue Nasonex 1-2 sprays each nostril daily  4. Continue omeprazole 40 mg twice a day  5. If needed: ProAir HFA 2 puffs every 4-6 hours  6. Diflucan 150 mg tablet single dose to treat thrush  7. Return to clinic in 6 months or earlier if problem

## 2016-01-17 ENCOUNTER — Ambulatory Visit (INDEPENDENT_AMBULATORY_CARE_PROVIDER_SITE_OTHER): Payer: Medicare Other | Admitting: Allergy and Immunology

## 2016-01-17 ENCOUNTER — Encounter: Payer: Self-pay | Admitting: Allergy and Immunology

## 2016-01-17 VITALS — BP 102/68 | HR 60 | Resp 22

## 2016-01-17 DIAGNOSIS — B370 Candidal stomatitis: Secondary | ICD-10-CM | POA: Diagnosis not present

## 2016-01-17 DIAGNOSIS — H1045 Other chronic allergic conjunctivitis: Secondary | ICD-10-CM | POA: Diagnosis not present

## 2016-01-17 DIAGNOSIS — J309 Allergic rhinitis, unspecified: Secondary | ICD-10-CM | POA: Diagnosis not present

## 2016-01-17 DIAGNOSIS — J387 Other diseases of larynx: Secondary | ICD-10-CM

## 2016-01-17 DIAGNOSIS — J45901 Unspecified asthma with (acute) exacerbation: Secondary | ICD-10-CM | POA: Diagnosis not present

## 2016-01-17 DIAGNOSIS — H1010 Acute atopic conjunctivitis, unspecified eye: Secondary | ICD-10-CM

## 2016-01-17 DIAGNOSIS — J441 Chronic obstructive pulmonary disease with (acute) exacerbation: Secondary | ICD-10-CM | POA: Diagnosis not present

## 2016-01-17 DIAGNOSIS — K219 Gastro-esophageal reflux disease without esophagitis: Secondary | ICD-10-CM

## 2016-01-17 MED ORDER — METHYLPREDNISOLONE ACETATE 80 MG/ML IJ SUSP: 80.0000 mg | Freq: Once | INTRAMUSCULAR | Status: DC

## 2016-01-17 MED ORDER — IPRATROPIUM-ALBUTEROL 0.5-2.5 (3) MG/3ML IN SOLN: 3.0000 mL | Freq: Four times a day (QID) | RESPIRATORY_TRACT | Status: DC

## 2016-01-17 MED ORDER — FLUCONAZOLE 150 MG PO TABS
150.0000 mg | ORAL_TABLET | Freq: Every day | ORAL | 0 refills | Status: DC
Start: 2016-01-17 — End: 2016-05-05

## 2016-01-17 NOTE — Progress Notes (Signed)
Follow-up Note  Referring Provider: Shelbie Ammons, MD Primary Provider: Galvin Proffer, MD Date of Office Visit: 01/17/2016  Subjective:   Tiffany Mcclure (DOB: 1956/02/01) is a 60 y.o. female who [facility]2016 in re-evaluation of the following:  HPI: Tiffany Mcclure returns to this clinic in reevaluation of her COPD with asthma, allergic rhinitis, reflux-induced respiratory disease. I last saw her in his clinic in June 2017.  She was doing very well but unfortunately last Monday she developed runny nose and nasal congestion and cough and wheezing and raspy voice and has been using her bronchodilator. She has not had any high fever or ugly nasal discharge or anosmia or chest pain.  Prior to this point time she was doing relatively well while using Breo 100 and Spiriva. Her requirement for bronchodilator was about twice a week. Her nose was doing very well while using some Nasonex. Her reflux was under pretty good control while using her proton pump inhibitor.    Medication List      albuterol (2.5 MG/3ML) 0.083% nebulizer solution Commonly known as:  PROVENTIL Take 2.5 mg by nebulization every 4 (four) hours as needed.   ALPRAZolam 0.5 MG tablet Commonly known as:  XANAX Take 0.5 mg by mouth. Take 1/2 to 1 tablet twice a day as needed for anxiety   Cetirizine HCl 10 MG Caps Take 1 capsule by mouth daily as needed.   clopidogrel 75 MG tablet Commonly known as:  PLAVIX Take 75 mg by mouth daily.   DULoxetine 60 MG capsule Commonly known as:  CYMBALTA Take 60 mg by mouth daily.   fluticasone furoate-vilanterol 200-25 MCG/INH Aepb Commonly known as:  BREO ELLIPTA Inhale 1 puff into the lungs daily. To prevent cough or wheeze   mometasone 50 MCG/ACT nasal spray Commonly known as:  NASONEX Place 1 spray into the nose daily.   omeprazole 40 MG capsule Commonly known as:  PRILOSEC Take 1 capsule (40 mg total) by mouth 2 (two) times  daily.   oxyCODONE-acetaminophen 5-325 MG tablet Commonly known as:  PERCOCET/ROXICET Take 1 tablet by mouth. Take 1 tablet 3 times a day as needed   rosuvastatin 20 MG tablet Commonly known as:  CRESTOR Take 20 mg by mouth daily.   Tiotropium Bromide Monohydrate 1.25 MCG/ACT Aers Commonly known as:  SPIRIVA RESPIMAT Inhale 2 Doses into the lungs daily.   topiramate 200 MG tablet Commonly known as:  TOPAMAX Take 200 mg by mouth 2 (two) times daily.   zolpidem 10 MG tablet Commonly known as:  AMBIEN Take 5 mg by mouth at bedtime.       Past Medical History:  Diagnosis Date  . Asthma   . Insomnia   . Migraines   . Stroke [facility])     Past Surgical History:  Procedure Laterality Date  . APPENDECTOMY    . FOOT SURGERY     both  . VESICOVAGINAL FISTULA CLOSURE W/ TAH    . WISDOM TOOTH EXTRACTION      Allergies  Allergen Reactions  . Rubbing Alcohol [Alcohol] Shortness Of Breath    Review of systems negative except as noted in HPI / PMHx or noted below:  Review of Systems  Constitutional: Negative.   HENT: Negative.   Eyes: Negative.   Respiratory: Negative.   Cardiovascular: Negative.   Gastrointestinal: Negative.   Genitourinary: Negative.   Musculoskeletal: Negative.   Skin: Negative.   Neurological: Negative.  Endo/Heme/Allergies: Negative.   Psychiatric/Behavioral: Negative.      Objective:   Vitals:   01/17/16 1544  BP: 102/68  Pulse: 60  Resp: (!) 22          Physical Exam  Constitutional: She is well-developed, well-nourished, and in no distress.  Coughing, rhinorrhea, raspy voice  HENT:  Head: Normocephalic.  Right Ear: Tympanic membrane, external ear and ear canal normal.  Left Ear: Tympanic membrane, external ear and ear canal normal.  Nose: Mucosal edema (Erythematous) present. No rhinorrhea.  Mouth/Throat: Uvula is midline, oropharynx is clear and moist and mucous membranes are normal. No oropharyngeal exudate (Thrush).  Eyes:  Conjunctivae are normal.  Neck: Trachea normal. No tracheal tenderness present. No tracheal deviation present. No thyromegaly present.  Cardiovascular: Normal rate, regular rhythm, S1 normal, S2 normal and normal heart sounds.   No murmur heard. Pulmonary/Chest: No stridor. No respiratory distress. She has wheezes (Bilateral inspiratory and expiratory wheezes in all lung fields). She has no rales.  Musculoskeletal: She exhibits no edema.  Lymphadenopathy:       Head (right side): No tonsillar adenopathy present.       Head (left side): No tonsillar adenopathy present.    She has no cervical adenopathy.  Neurological: She is alert. Gait normal.  Skin: No rash noted. She is not diaphoretic. No erythema. Nails show no clubbing.  Psychiatric: Mood and affect normal.    Diagnostics:    Spirometry was performed and demonstrated an FEV1 of 1.16 at 49 % of predicted.  The patient had an Asthma Control Test with the following results: ACT Total Score: 12.    Assessment and Plan:   1. Acute exacerbation of COPD with asthma (HCC)   2. Allergic rhinoconjunctivitis   3. LPRD (laryngopharyngeal reflux disease)   4. Thrush     1. Breo 200 one inhalation one time per day  2. Continue Spiriva HandiHaler one inhalations one time per day  3. Continue Nasonex 1-2 sprays each nostril daily  4. Continue omeprazole 40 mg twice a day  5. If needed: ProAir HFA 2 puffs every 4-6 hours  6. Diflucan 150 mg tablet single dose to treat thrush  7. For this most recent flareup:   A. DuoNeb nebulized in clinic  B. Depo-Medrol 80 IM  - patient refused  C. OTC Mucinex DM 2 tablets twice a day  D. nasal saline multiple times a day  E. prednisone 10 mg one tablet twice a day for 5 days  7. Return to clinic in 6 months or earlier if problem  8. Obtain fall flu vaccine  [name] obviously has an exacerbation of her respiratory tract disease and we'll treat her with the therapy mentioned above. She  refuses to get a Depo-Medrol injection and thus will try to use prednisone at 20 mg a day for the next 5 days while we increase her dose of inhaled steroid from Breo 100 to Breo 200. She'll keep in contact with me noting her response and will make a decision about how to proceed pending his response. If she does well I'll see her back in this clinic in approximately 6 months.  Laurette SchimkeEric Griffith Santilli, MD [facility]

## 2016-01-17 NOTE — Patient Instructions (Addendum)
   1. Breo 200 one inhalation one time per day  2. Continue Spiriva HandiHaler one inhalations one time per day  3. Continue Nasonex 1-2 sprays each nostril daily  4. Continue omeprazole 40 mg twice a day  5. If needed: ProAir HFA 2 puffs every 4-6 hours  6. Diflucan 150 mg tablet single dose to treat thrush  7. For this most recent flareup:   A. DuoNeb nebulized in clinic  B. Depo-Medrol 80 IM delivering clinic  C. OTC Mucinex DM 2 tablets twice a day  D. nasal saline multiple times a day  E. prednisone 10 mg one tablet twice a day for 5 days  7. Return to clinic in 6 months or earlier if problem  8. Obtain fall flu vaccine

## 2016-03-13 ENCOUNTER — Other Ambulatory Visit: Payer: Self-pay | Admitting: *Deleted

## 2016-03-14 ENCOUNTER — Other Ambulatory Visit: Payer: Self-pay | Admitting: *Deleted

## 2016-03-14 MED ORDER — FLUTICASONE FUROATE-VILANTEROL 100-25 MCG/INH IN AEPB: INHALATION_SPRAY | RESPIRATORY_TRACT | 1 refills | Status: DC

## 2016-05-05 ENCOUNTER — Ambulatory Visit (INDEPENDENT_AMBULATORY_CARE_PROVIDER_SITE_OTHER): Payer: Medicare Other | Admitting: Allergy and Immunology

## 2016-05-05 ENCOUNTER — Encounter: Payer: Self-pay | Admitting: Allergy and Immunology

## 2016-05-05 VITALS — BP 138/66 | HR 72 | Resp 20

## 2016-05-05 DIAGNOSIS — K219 Gastro-esophageal reflux disease without esophagitis: Secondary | ICD-10-CM

## 2016-05-05 DIAGNOSIS — J449 Chronic obstructive pulmonary disease, unspecified: Secondary | ICD-10-CM | POA: Diagnosis not present

## 2016-05-05 DIAGNOSIS — J3089 Other allergic rhinitis: Secondary | ICD-10-CM | POA: Diagnosis not present

## 2016-05-05 NOTE — Progress Notes (Signed)
Follow-up Note  Referring Provider: Shelbie AmmonsHaque, Imran P, MD Primary Provider: Galvin ProfferHAGUE, IMRAN P, MD Date of Office Visit: 05/05/2016  Subjective:   Tiffany Mcclure (DOB: 02/18/1956) is a 60 y.o. female [facility] in re-evaluation of the following:  HPI: [name] returns to this clinic in reevaluation of her COPD with asthma, allergic rhinitis, and reflux-induced respiratory disease. Last visit to this clinic was August 2017.  During the interval she has done very well. She has not required either a systemic steroid or an antibiotic to treat her respiratory tract issue. She does not need to use a short acting bronchodilator more than one time per week and she can exercise without any difficulty. She can smell and taste with no problem. She's been consistently using a combination of Breo and Spiriva and Nasonex.  Her reflux is under excellent control. She has no problems with her throat.  She refuses to receive the flu vaccine.  Allergies as of 05/05/2016      Reactions   Rubbing Alcohol [alcohol] Shortness Of Breath      Medication List      albuterol (2.5 MG/3ML) 0.083% nebulizer solution Commonly known as:  PROVENTIL Take 2.5 mg by nebulization every 4 (four) hours as needed.   ALPRAZolam 0.5 MG tablet Commonly known as:  XANAX Take 0.5 mg by mouth. Take 1/2 to 1 tablet twice a day as needed for anxiety   Cetirizine HCl 10 MG Caps Take 1 capsule by mouth daily as needed.   clopidogrel 75 MG tablet Commonly known as:  PLAVIX Take 75 mg by mouth daily.   DULoxetine 60 MG capsule Commonly known as:  CYMBALTA Take 60 mg by mouth daily.   fluticasone furoate-vilanterol 200-25 MCG/INH Aepb Commonly known as:  BREO ELLIPTA Inhale 1 puff into the lungs daily. To prevent cough or wheeze   fluticasone furoate-vilanterol 100-25 MCG/INH Aepb Commonly known as:  BREO ELLIPTA Inhale one puff once daily   mometasone 50 MCG/ACT  nasal spray Commonly known as:  NASONEX Place 1 spray into the nose daily.   omeprazole 40 MG capsule Commonly known as:  PRILOSEC Take 1 capsule (40 mg total) by mouth 2 (two) times daily.   oxyCODONE-acetaminophen 5-325 MG tablet Commonly known as:  PERCOCET/ROXICET Take 1 tablet by mouth. Take 1 tablet 3 times a day as needed   rosuvastatin 20 MG tablet Commonly known as:  CRESTOR Take 20 mg by mouth daily.   Tiotropium Bromide Monohydrate 1.25 MCG/ACT Aers Commonly known as:  SPIRIVA RESPIMAT Inhale 2 Doses into the lungs daily.   topiramate 200 MG tablet Commonly known as:  TOPAMAX Take 200 mg by mouth 2 (two) times daily.   zolpidem 10 MG tablet Commonly known as:  AMBIEN Take 5 mg by mouth at bedtime.       Past Medical History:  Diagnosis Date  . Asthma   . Insomnia   . Migraines   . Stroke [facility](HCC)     Past Surgical History:  Procedure Laterality Date  . APPENDECTOMY    . FOOT SURGERY     both  . VESICOVAGINAL FISTULA CLOSURE W/ TAH    . WISDOM TOOTH EXTRACTION      Review of systems negative except as noted in HPI / PMHx or noted below:  Review of Systems  Constitutional: Negative.   HENT: Negative.   Eyes: Negative.   Respiratory: Negative.   Cardiovascular: Negative.   Gastrointestinal:  Negative.   Genitourinary: Negative.   Musculoskeletal: Negative.   Skin: Negative.   Neurological: Negative.   Endo/Heme/Allergies: Negative.   Psychiatric/Behavioral: Negative.      Objective:   Vitals:   05/05/16 1614  BP: 138/66  Pulse: 72  Resp: 20          Physical Exam  Constitutional: She is well-developed, well-nourished, and in no distress.  HENT:  Head: Normocephalic.  Right Ear: Tympanic membrane, external ear and ear canal normal.  Left Ear: Tympanic membrane, external ear and ear canal normal.  Nose: Nose normal. No mucosal edema or rhinorrhea.  Mouth/Throat: Uvula is midline, oropharynx is clear and moist and mucous membranes  are normal. No oropharyngeal exudate.  Eyes: Conjunctivae are normal.  Neck: Trachea normal. No tracheal tenderness present. No tracheal deviation present. No thyromegaly present.  Cardiovascular: Normal rate, regular rhythm, S1 normal, S2 normal and normal heart sounds.   No murmur heard. Pulmonary/Chest: Breath sounds normal. No stridor. No respiratory distress. She has no wheezes. She has no rales.  Musculoskeletal: She exhibits no edema.  Lymphadenopathy:       Head (right side): No tonsillar adenopathy present.       Head (left side): No tonsillar adenopathy present.    She has no cervical adenopathy.  Neurological: She is alert. Gait normal.  Skin: No rash noted. She is not diaphoretic. No erythema. Nails show no clubbing.  Psychiatric: Mood and affect normal.    Diagnostics:    Spirometry was performed and demonstrated an FEV1 of 1.81 at 77 % of predicted.  Assessment and Plan:   1. COPD with asthma (HCC)   2. Other allergic rhinitis   3. LPRD (laryngopharyngeal reflux disease)     1. Breo 200 one inhalation one time per day  2. Continue Spiriva HandiHaler one inhalations one time per day  3. Continue Nasonex 1-2 sprays each nostril daily  4. Continue omeprazole 40 mg twice a day  5. If needed: ProAir HFA 2 puffs every 4-6 hours  6. Return to clinic in 6 months or earlier if problem   [name] appears to be doing quite well at this point in time and I will continue to have her use anti-inflammatory agents for her respiratory tract and therapy directed against reflux. She will return to this clinic in 6 months or earlier if there is a problem.  Laurette SchimkeEric Kozlow, MD [facility]

## 2016-05-05 NOTE — Patient Instructions (Signed)
   1. Breo 200 one inhalation one time per day  2. Continue Spiriva HandiHaler one inhalations one time per day  3. Continue Nasonex 1-2 sprays each nostril daily  4. Continue omeprazole 40 mg twice a day  5. If needed: ProAir HFA 2 puffs every 4-6 hours  6. Return to clinic in 6 months or earlier if problem

## 2016-07-03 ENCOUNTER — Other Ambulatory Visit: Payer: Self-pay

## 2016-07-03 ENCOUNTER — Encounter: Payer: Self-pay | Admitting: Allergy and Immunology

## 2016-07-03 ENCOUNTER — Ambulatory Visit (INDEPENDENT_AMBULATORY_CARE_PROVIDER_SITE_OTHER): Payer: Medicare Other | Admitting: Allergy and Immunology

## 2016-07-03 VITALS — BP 116/70 | HR 92 | Resp 24

## 2016-07-03 DIAGNOSIS — J45901 Unspecified asthma with (acute) exacerbation: Secondary | ICD-10-CM

## 2016-07-03 DIAGNOSIS — J441 Chronic obstructive pulmonary disease with (acute) exacerbation: Secondary | ICD-10-CM

## 2016-07-03 DIAGNOSIS — J3089 Other allergic rhinitis: Secondary | ICD-10-CM | POA: Diagnosis not present

## 2016-07-03 DIAGNOSIS — K219 Gastro-esophageal reflux disease without esophagitis: Secondary | ICD-10-CM | POA: Diagnosis not present

## 2016-07-03 MED ORDER — BECLOMETHASONE DIPROPIONATE 80 MCG/ACT IN AERS: 2.0000 | INHALATION_SPRAY | Freq: Two times a day (BID) | RESPIRATORY_TRACT | 0 refills | Status: DC

## 2016-07-03 MED ORDER — AZITHROMYCIN 500 MG PO TABS: 500.0000 mg | ORAL_TABLET | Freq: Every day | ORAL | 0 refills | Status: AC

## 2016-07-03 MED ORDER — FLUTICASONE FUROATE-VILANTEROL 200-25 MCG/INH IN AEPB: 1.0000 | INHALATION_SPRAY | Freq: Every day | RESPIRATORY_TRACT | 5 refills | Status: DC

## 2016-07-03 MED ORDER — FLUTICASONE PROPIONATE 50 MCG/ACT NA SUSP: 2.0000 | Freq: Every day | NASAL | 5 refills | Status: DC

## 2016-07-03 MED ORDER — IPRATROPIUM BROMIDE 0.02 % IN SOLN: 0.5000 mg | Freq: Once | RESPIRATORY_TRACT | Status: DC

## 2016-07-03 MED ORDER — MOMETASONE FUROATE 50 MCG/ACT NA SUSP: NASAL | 5 refills | Status: DC

## 2016-07-03 MED ORDER — ALBUTEROL SULFATE (2.5 MG/3ML) 0.083% IN NEBU: 2.5000 mg | INHALATION_SOLUTION | Freq: Once | RESPIRATORY_TRACT | Status: DC

## 2016-07-03 NOTE — Progress Notes (Signed)
Azithromycin 500  Follow-up Note  Referring Provider: Shelbie Mcclure, Tiffany P, MD Primary Provider: Galvin Mcclure, Tiffany P, MD Date of Office Visit: 07/03/2016  Subjective:   Tiffany Mcclure (DOB: 1955/06/02) is a 61 y.o. female who [facility]2016 in re-evaluation of the following:  HPI: Tiffany Mcclure returns to this clinic in reevaluation of her asthma with component of COPD, allergic rhinitis, and reflux-induced respiratory disease. I last saw her in this clinic on 05/05/2016 at which time she was doing wonderful and there was a plan to see her back in this clinic in 6 months.  Unfortunately, about 10 days ago she developed fever and nausea and chills and aches and was given Tamiflu for influenza. She was better within several days but then once again developed very significant problems with nausea. In addition, she now has respiratory tract symptoms in the form of coughing and wheezing and runny nose that is predominantly clear. She has no associated anosmia or ugly nasal discharge. She definitely has chest tightness but no chest pain and no fever and no aches at this point. She is still nauseated when she smells foods and she is using some antinausea medications that were given to her by her doctor. She does not have any associated diarrhea or abdominal cramping.  Allergies as of 07/03/2016      Reactions   Rubbing Alcohol [alcohol] Shortness Of Breath      Medication List      albuterol (2.5 MG/3ML) 0.083% nebulizer solution Commonly known as:  PROVENTIL Take 2.5 mg by nebulization every 4 (four) hours as needed.   ALPRAZolam 0.5 MG tablet Commonly known as:  XANAX Take 0.5 mg by mouth. Take 1/2 to 1 tablet twice a day as needed for anxiety   Cetirizine HCl 10 MG Caps Take 1 capsule by mouth daily as needed.   clopidogrel 75 MG tablet Commonly known as:  PLAVIX Take 75 mg by mouth daily.   DULoxetine 60 MG capsule Commonly known as:  CYMBALTA Take  60 mg by mouth daily.   fluticasone furoate-vilanterol 100-25 MCG/INH Aepb Commonly known as:  BREO ELLIPTA Inhale one puff once daily   MOBIC PO Take 1 tablet by mouth daily.   omeprazole 40 MG capsule Commonly known as:  PRILOSEC Take 1 capsule (40 mg total) by mouth 2 (two) times daily.   oxyCODONE-acetaminophen 5-325 MG tablet Commonly known as:  PERCOCET/ROXICET Take 1 tablet by mouth. Take 1 tablet 3 times a day as needed   rosuvastatin 20 MG tablet Commonly known as:  CRESTOR Take 20 mg by mouth daily.   topiramate 200 MG tablet Commonly known as:  TOPAMAX Take 200 mg by mouth 2 (two) times daily.   zolpidem 10 MG tablet Commonly known as:  AMBIEN Take 5 mg by mouth at bedtime.       Past Medical History:  Diagnosis Date  . Asthma   . Insomnia   . Migraines   . Stroke [facility](HCC)     Past Surgical History:  Procedure Laterality Date  . APPENDECTOMY    . FOOT SURGERY     both  . VESICOVAGINAL FISTULA CLOSURE W/ TAH    . WISDOM TOOTH EXTRACTION      Review of systems negative except as noted in HPI / PMHx or noted below:  Review of Systems  Constitutional: Negative.   HENT: Negative.   Eyes: Negative.   Respiratory: Negative.   Cardiovascular: Negative.   Gastrointestinal: Negative.  Genitourinary: Negative.   Musculoskeletal: Negative.   Skin: Negative.   Neurological: Negative.   Endo/Heme/Allergies: Negative.   Psychiatric/Behavioral: Negative.      Objective:   Vitals:   07/03/16 1049  BP: 116/70  Pulse: 92  Resp: (!) 24          Physical Exam  Constitutional: She is well-developed, well-nourished, and in no distress.  HENT:  Head: Normocephalic.  Right Ear: Tympanic membrane, external ear and ear canal normal.  Left Ear: Tympanic membrane, external ear and ear canal normal.  Nose: Nose normal. No mucosal edema or rhinorrhea.  Mouth/Throat: Uvula is midline, oropharynx is clear and moist and mucous membranes are normal. No  oropharyngeal exudate.  Eyes: Conjunctivae are normal.  Neck: Trachea normal. No tracheal tenderness present. No tracheal deviation present. No thyromegaly present.  Cardiovascular: Normal rate, regular rhythm, S1 normal, S2 normal and normal heart sounds.   No murmur heard. Pulmonary/Chest: Breath sounds normal. No stridor. No respiratory distress. She has no wheezes. She has no rales.  Musculoskeletal: She exhibits no edema.  Lymphadenopathy:       Head (right side): No tonsillar adenopathy present.       Head (left side): No tonsillar adenopathy present.    She has no cervical adenopathy.  Neurological: She is alert. Gait normal.  Skin: No rash noted. She is not diaphoretic. No erythema. Nails show no clubbing.  Psychiatric: Mood and affect normal.    Diagnostics:    Spirometry was performed and demonstrated an FEV1 of 1.22 at 50 % of predicted.  The patient had an Asthma Control Test with the following results: ACT Total Score: 10.    Assessment and Plan:   1. Asthma with COPD with exacerbation (HCC)   2. Other allergic rhinitis   3. LPRD (laryngopharyngeal reflux disease)     1. Continue Breo 200 one inhalation one time per day  2. Continue Spiriva 1.25 respimat two inhalations or handihaler one inhalation one time per day  3. Continue Nasonex 1-2 sprays each nostril daily  4. Continue omeprazole 40 mg twice a day  5. If needed: ProAir HFA 2 puffs every 4-6 hours or albuterol nebulization  6. For this most recent episode:   A. Nebulized albuterol / impratropium  B. Prednisone 40 mg now, then 30mg  daily for 5 days  C. Add OVAR 80 2 inhalations 2 times a day until clear  D. azithromycin 500 one tablet one time per day for 3 days  7. Further evaluation and treatment?  8. Return to clinic in 6 months or earlier if problem   Tiffany Mcclure appears to have a respiratory tract infection and this comes on the heels of just completing influenza. I'm going to treat her with a  combination of systemic steroids and antibiotics as noted above and she will keep in contact with me regarding her response to this approach. She will continue on anti-inflammatory agents for respiratory tract and therapy directed against reflux as noted above and I will see her back in this clinic in 6 months or earlier if there is a problem.  [name], MD Allergy / Immunology [facility]

## 2016-07-03 NOTE — Patient Instructions (Addendum)
   1. Continue Breo 200 one inhalation one time per day  2. Continue Spiriva 1.25 respimat two inhalations or handihaler one inhalation one time per day  3. Continue Nasonex 1-2 sprays each nostril daily  4. Continue omeprazole 40 mg twice a day  5. If needed: ProAir HFA 2 puffs every 4-6 hours or albuterol nebulization  6. For this most recent episode:   A. Nebulized albuterol / impratropium  B. Prednisone 40 mg now, then 30mg  daily for 5 days  C. Add OVAR 80 2 inhalations 2 times a day until clear  D. azithromycin 500 one tablet one time per day for 3 days  7. Further evaluation and treatment?  8. Return to clinic in 6 months or earlier if problem

## 2016-09-11 ENCOUNTER — Other Ambulatory Visit: Payer: Self-pay | Admitting: *Deleted

## 2016-09-11 MED ORDER — OMEPRAZOLE 40 MG PO CPDR: DELAYED_RELEASE_CAPSULE | ORAL | 1 refills | Status: DC

## 2016-11-03 ENCOUNTER — Ambulatory Visit: Payer: Medicare Other | Admitting: Allergy and Immunology

## 2016-12-01 ENCOUNTER — Other Ambulatory Visit: Payer: Self-pay | Admitting: Allergy and Immunology

## 2016-12-31 ENCOUNTER — Ambulatory Visit: Payer: Medicare Other | Admitting: Allergy and Immunology

## 2017-03-09 ENCOUNTER — Other Ambulatory Visit: Payer: Self-pay | Admitting: Allergy and Immunology

## 2017-03-16 ENCOUNTER — Other Ambulatory Visit: Payer: Self-pay | Admitting: Allergy and Immunology

## 2017-03-16 ENCOUNTER — Ambulatory Visit (INDEPENDENT_AMBULATORY_CARE_PROVIDER_SITE_OTHER): Payer: Medicare Other | Admitting: Allergy and Immunology

## 2017-03-16 VITALS — BP 118/70 | HR 62 | Resp 18

## 2017-03-16 DIAGNOSIS — J449 Chronic obstructive pulmonary disease, unspecified: Secondary | ICD-10-CM

## 2017-03-16 DIAGNOSIS — K219 Gastro-esophageal reflux disease without esophagitis: Secondary | ICD-10-CM | POA: Diagnosis not present

## 2017-03-16 DIAGNOSIS — J3089 Other allergic rhinitis: Secondary | ICD-10-CM | POA: Diagnosis not present

## 2017-03-16 MED ORDER — FLUTICASONE FUROATE-VILANTEROL 200-25 MCG/INH IN AEPB: 1.0000 | INHALATION_SPRAY | Freq: Every day | RESPIRATORY_TRACT | 5 refills | Status: AC

## 2017-03-16 MED ORDER — OMEPRAZOLE 40 MG PO CPDR: DELAYED_RELEASE_CAPSULE | ORAL | 1 refills | Status: AC

## 2017-03-16 MED ORDER — CETIRIZINE HCL 10 MG PO CAPS: 1.0000 | ORAL_CAPSULE | Freq: Every day | ORAL | 5 refills | Status: AC | PRN

## 2017-03-16 MED ORDER — ALBUTEROL SULFATE (2.5 MG/3ML) 0.083% IN NEBU: 2.5000 mg | INHALATION_SOLUTION | RESPIRATORY_TRACT | 1 refills | Status: AC | PRN

## 2017-03-16 MED ORDER — FLUTICASONE PROPIONATE 50 MCG/ACT NA SUSP: 2.0000 | Freq: Every day | NASAL | 5 refills | Status: DC

## 2017-03-16 MED ORDER — TIOTROPIUM BROMIDE MONOHYDRATE 1.25 MCG/ACT IN AERS: 2.0000 | INHALATION_SPRAY | Freq: Every day | RESPIRATORY_TRACT | 0 refills | Status: AC

## 2017-03-16 NOTE — Progress Notes (Signed)
Follow-up Note  Referring Provider: Shelbie Ammons, MD Primary Provider: Shelbie Ammons, MD Date of Office Visit: 03/16/2017  Subjective:   Tiffany Mcclure (DOB: [birth year]) is a 61 y.o. female who [facility]2017 in re-evaluation of the following:  HPI: Tiffany Mcclure returns to this clinic in reevaluation of her COPD with asthma, allergic rhinitis, and reflux-induced respiratory disease. Her last visit to this clinic was February 2018. At that point in time she appeared to have influenza and was slowly improving from that event.  Overall she has done very well since February and has not required an antibiotic or systemic steroid to treat any type of respiratory tract issue and her requirement for short acting bronchodilator is less than twice a week and she can exert herself without any problem.  Her nose has really been doing quite well.  Her reflux has been under excellent control but she does require omeprazole twice a day otherwise she gets regurgitation and a fair amount of issues with throat clearing and throat irritation.  Allergies as of 03/16/2017      Reactions   Rubbing Alcohol [alcohol] Shortness Of Breath      Medication List      albuterol (2.5 MG/3ML) 0.083% nebulizer solution Commonly known as:  PROVENTIL Take 2.5 mg by nebulization every 4 (four) hours as needed.   ALPRAZolam 0.5 MG tablet Commonly known as:  XANAX Take 0.5 mg by mouth. Take 1/2 to 1 tablet twice a day as needed for anxiety   Cetirizine HCl 10 MG Caps Take 1 capsule by mouth daily as needed.   clopidogrel 75 MG tablet Commonly known as:  PLAVIX Take 75 mg by mouth daily.   DULoxetine 60 MG capsule Commonly known as:  CYMBALTA Take 60 mg by mouth daily.   fluticasone 50 MCG/ACT nasal spray Commonly known as:  FLONASE Place 2 sprays into both nostrils daily.   fluticasone furoate-vilanterol 200-25 MCG/INH Aepb Commonly known as:  BREO  ELLIPTA Inhale 1 puff into the lungs daily. To prevent cough or wheeze   MOBIC PO Take 1 tablet by mouth daily.   omeprazole 40 MG capsule Commonly known as:  PRILOSEC Take one capsule twice daily as directed   oxyCODONE-acetaminophen 5-325 MG tablet Commonly known as:  PERCOCET/ROXICET Take 1 tablet by mouth. Take 1 tablet 3 times a day as needed   rosuvastatin 20 MG tablet Commonly known as:  CRESTOR Take 20 mg by mouth daily.   SPIRIVA RESPIMAT 1.25 MCG/ACT Aers Generic drug:  Tiotropium Bromide Monohydrate INHALE 2 DOSES INTO THE LUNGS DAILY.   topiramate 200 MG tablet Commonly known as:  TOPAMAX Take 200 mg by mouth 2 (two) times daily.   zolpidem 10 MG tablet Commonly known as:  AMBIEN Take 5 mg by mouth at bedtime.       Past Medical History:  Diagnosis Date  . Asthma   . Insomnia   . Migraines   . Stroke [facility])     Past Surgical History:  Procedure Laterality Date  . APPENDECTOMY    . FOOT SURGERY     both  . VESICOVAGINAL FISTULA CLOSURE W/ TAH    . WISDOM TOOTH EXTRACTION      Review of systems negative except as noted in HPI / PMHx or noted below:  Review of Systems  Constitutional: Negative.   HENT: Negative.   Eyes: Negative.   Respiratory: Negative.   Cardiovascular: Negative.  Gastrointestinal: Negative.   Genitourinary: Negative.   Musculoskeletal: Negative.   Skin: Negative.   Neurological: Negative.   Endo/Heme/Allergies: Negative.   Psychiatric/Behavioral: Negative.      Objective:   Vitals:   03/16/17 1136  BP: 118/70  Pulse: 62  Resp: 18          Physical Exam  Constitutional: She is well-developed, well-nourished, and in no distress.  HENT:  Head: Normocephalic.  Right Ear: Tympanic membrane, external ear and ear canal normal.  Left Ear: Tympanic membrane, external ear and ear canal normal.  Nose: Nose normal. No mucosal edema or rhinorrhea.  Mouth/Throat: Uvula is midline, oropharynx is clear and moist and  mucous membranes are normal. No oropharyngeal exudate.  Eyes: Conjunctivae are normal.  Neck: Trachea normal. No tracheal tenderness present. No tracheal deviation present. No thyromegaly present.  Cardiovascular: Normal rate, regular rhythm, S1 normal, S2 normal and normal heart sounds.   No murmur heard. Pulmonary/Chest: Breath sounds normal. No stridor. No respiratory distress. She has no wheezes. She has no rales.  Musculoskeletal: She exhibits no edema.  Lymphadenopathy:       Head (right side): No tonsillar adenopathy present.       Head (left side): No tonsillar adenopathy present.    She has no cervical adenopathy.  Neurological: She is alert. Gait normal.  Skin: No rash noted. She is not diaphoretic. No erythema. Nails show no clubbing.  Psychiatric: Mood and affect normal.    Diagnostics:    Spirometry was performed and demonstrated an FEV1 of 1.77 at 73 % of predicted.  The patient had an Asthma Control Test with the following results: ACT Total Score: 12.    Assessment and Plan:   1. COPD with asthma (HCC)   2. Other allergic rhinitis   3. LPRD (laryngopharyngeal reflux disease)     1. Continue Breo 200 one inhalation one time per day  2. Continue Spiriva handihaler one inhalation one time per day  3. Continue Nasonex 1-2 sprays each nostril daily  4. Continue omeprazole 40 mg twice a day  5. If needed:    A. ProAir HFA 2 puffs every 4-6 hours or albuterol nebulization  B. OTC antihistamine  C. OTC Mucinex DM  6. Return to clinic in 6 months or earlier if problem   Overall [name] appears to be doing relatively well on her current plan and she will continue to use anti-inflammatory agents for her respiratory tract and therapy directed against reflux and I will see her back in this clinic in 6 months or earlier if there is a problem.  Laurette SchimkeEric Johneisha Broaden, MD Allergy / Immunology [facility]

## 2017-03-16 NOTE — Patient Instructions (Addendum)
   1. Continue Breo 200 one inhalation one time per day  2. Continue Spiriva handihaler one inhalation one time per day  3. Continue Nasonex 1-2 sprays each nostril daily  4. Continue omeprazole 40 mg twice a day  5. If needed:    A. ProAir HFA 2 puffs every 4-6 hours or albuterol nebulization  B. OTC antihistamine  C. OTC Mucinex DM  6. Return to clinic in 6 months or earlier if problem

## 2017-03-17 ENCOUNTER — Encounter: Payer: Self-pay | Admitting: Allergy and Immunology

## 2017-09-17 ENCOUNTER — Encounter: Payer: Self-pay | Admitting: Allergy and Immunology

## 2017-09-17 ENCOUNTER — Ambulatory Visit (INDEPENDENT_AMBULATORY_CARE_PROVIDER_SITE_OTHER): Payer: Medicare Other | Admitting: Allergy and Immunology

## 2017-09-17 VITALS — BP 120/76 | HR 60 | Resp 26

## 2017-09-17 DIAGNOSIS — B370 Candidal stomatitis: Secondary | ICD-10-CM | POA: Diagnosis not present

## 2017-09-17 DIAGNOSIS — J449 Chronic obstructive pulmonary disease, unspecified: Secondary | ICD-10-CM | POA: Diagnosis not present

## 2017-09-17 DIAGNOSIS — J3089 Other allergic rhinitis: Secondary | ICD-10-CM | POA: Diagnosis not present

## 2017-09-17 DIAGNOSIS — K219 Gastro-esophageal reflux disease without esophagitis: Secondary | ICD-10-CM

## 2017-09-17 MED ORDER — NYSTATIN 100000 UNIT/ML MT SUSP: OROMUCOSAL | 5 refills | Status: DC

## 2017-09-17 NOTE — Patient Instructions (Addendum)
   1. Continue Breo 200 one inhalation one time per day  2. Continue Spiriva Respimat 2 inhalations one time per day  3. Continue omeprazole 40 mg twice a day  4. Start OTC Nasacort 1-2 sprays each nostril daily  5. Start nystatin oral solution - swish and swallow 3-7 times per week  6. If needed:    A. ProAir HFA 2 puffs every 4-6 hours or albuterol nebulization  B. OTC antihistamine  C. OTC Mucinex DM  7. Return to clinic in 6 months or earlier if problem

## 2017-09-17 NOTE — Progress Notes (Signed)
Follow-up Note  Referring Provider: Shelbie Ammons, MD Primary Provider: Shelbie Ammons, MD Date of Office Visit: 09/17/2017  Subjective:   Tiffany Mcclure (DOB: [birth year]) is a 62 y.o. female who [facility] in re-evaluation of the following:  HPI: Tiffany Mcclure returns to this clinic in reevaluation of COPD with asthma, allergic rhinitis, and a history of reflux induced respiratory disease.  Her last visit to this clinic was 16 March 2017.  For the most part she thinks that she has done well and has not required a systemic steroid or antibiotic other than one event that appeared to occur in February after contracting a head cold.  Apparently she ended up in the urgent care center and she received a steroid and antibiotic and all her symptoms have resolved.  Her requirement for short acting bronchodilator is less than 1 time per week.  She still does get short of breath if she she exerts herself to any extent.  Her nose has not been causing her any problem.  Her reflux is been under excellent control on her current therapy.  Her insurance company eliminated use of Nasonex.  She cannot tolerate the Flonase.  She continues to use a combination of Breo and Spiriva and also continues on omeprazole.  Allergies as of 09/17/2017      Reactions   Rubbing Alcohol [alcohol] Shortness Of Breath      Medication List      albuterol (2.5 MG/3ML) 0.083% nebulizer solution Commonly known as:  PROVENTIL Take 3 mLs (2.5 mg total) by nebulization every 4 (four) hours as needed.   ALPRAZolam 0.5 MG tablet Commonly known as:  XANAX Take 0.5 mg by mouth. Take 1/2 to 1 tablet twice a day as needed for anxiety   Cetirizine HCl 10 MG Caps Take 1 capsule (10 mg total) by mouth daily as needed.   clopidogrel 75 MG tablet Commonly known as:  PLAVIX Take 75 mg by mouth daily.   DULoxetine 60 MG capsule Commonly known as:  CYMBALTA Take 60 mg by mouth  daily.   EYE VITAMINS PO Take by mouth.   fluticasone 50 MCG/ACT nasal spray Commonly known as:  FLONASE Place 2 sprays into both nostrils daily.   fluticasone furoate-vilanterol 200-25 MCG/INH Aepb Commonly known as:  BREO ELLIPTA Inhale 1 puff into the lungs daily. To prevent cough or wheeze   MOBIC PO Take 1 tablet by mouth daily.   multivitamin tablet Take 1 tablet by mouth daily.   omeprazole 40 MG capsule Commonly known as:  PRILOSEC Take one capsule twice daily as directed   oxyCODONE-acetaminophen 5-325 MG tablet Commonly known as:  PERCOCET/ROXICET Take 1 tablet by mouth. Take 1 tablet 3 times a day as needed   rosuvastatin 20 MG tablet Commonly known as:  CRESTOR Take 20 mg by mouth daily.   Tiotropium Bromide Monohydrate 1.25 MCG/ACT Aers Commonly known as:  SPIRIVA RESPIMAT Inhale 2 puffs into the lungs daily.   topiramate 200 MG tablet Commonly known as:  TOPAMAX Take 200 mg by mouth 2 (two) times daily.   zolpidem 10 MG tablet Commonly known as:  AMBIEN Take 5 mg by mouth at bedtime.       Past Medical History:  Diagnosis Date  . Asthma   . Insomnia   . Migraines   . Stroke [facility])     Past Surgical History:  Procedure Laterality Date  . APPENDECTOMY    .  FOOT SURGERY     both  . VESICOVAGINAL FISTULA CLOSURE W/ TAH    . WISDOM TOOTH EXTRACTION      Review of systems negative except as noted in HPI / PMHx or noted below:  Review of Systems  Constitutional: Negative.   HENT: Negative.   Eyes: Negative.   Respiratory: Negative.   Cardiovascular: Negative.   Gastrointestinal: Negative.   Genitourinary: Negative.   Musculoskeletal: Negative.   Skin: Negative.   Neurological: Negative.   Endo/Heme/Allergies: Negative.   Psychiatric/Behavioral: Negative.      Objective:   Vitals:   09/17/17 0848  BP: 120/76  Pulse: 60  Resp: (!) 26          Physical Exam  HENT:  Head: Normocephalic.  Right Ear: Tympanic membrane,  external ear and ear canal normal.  Left Ear: Tympanic membrane, external ear and ear canal normal.  Nose: Nose normal. No mucosal edema or rhinorrhea.  Mouth/Throat: Uvula is midline and mucous membranes are normal. Oropharyngeal exudate (Thrush) present.  Eyes: Conjunctivae are normal.  Neck: Trachea normal. No tracheal tenderness present. No tracheal deviation present. No thyromegaly present.  Cardiovascular: Normal rate, regular rhythm, S1 normal, S2 normal and normal heart sounds.  No murmur heard. Pulmonary/Chest: Breath sounds normal. No stridor. No respiratory distress. She has no wheezes. She has no rales.  Musculoskeletal: She exhibits no edema.  Lymphadenopathy:       Head (right side): No tonsillar adenopathy present.       Head (left side): No tonsillar adenopathy present.    She has no cervical adenopathy.  Neurological: She is alert.  Skin: No rash noted. She is not diaphoretic. No erythema. Nails show no clubbing.    Diagnostics:    Spirometry was performed and demonstrated an FEV1 of 1.87 at 81 % of predicted.  The patient had an Asthma Control Test with the following results: ACT Total Score: 21.    Assessment and Plan:   1. COPD with asthma (HCC)   2. Other allergic rhinitis   3. LPRD (laryngopharyngeal reflux disease)   4. Thrush     1. Continue Breo 200 one inhalation one time per day  2. Continue Spiriva Respimat 2 inhalations one time per day  3. Continue omeprazole 40 mg twice a day  4. Start OTC Nasacort 1-2 sprays each nostril daily  5. Start nystatin oral solution - swish and swallow 3-7 times per week  6. If needed:    A. ProAir HFA 2 puffs every 4-6 hours or albuterol nebulization  B. OTC antihistamine  C. OTC Mucinex DM  7. Return to clinic in 6 months or earlier if problem   Tiffany Mcclure appears to be doing relatively well.  I think she would benefit from consistently using a nasal steroid and will give her samples of Nasacort to use  at least a few times a week.  As well, she appears to have asymptomatic thrush and I will give her nystatin to use a few times per week on a regular basis.  She will continue on anti-inflammatory agents for respiratory tract and therapy directed against reflux as noted above.  I will regroup with her in 6 months or earlier if there is a problem.  [name], MD Allergy / Immunology [facility]

## 2017-09-21 ENCOUNTER — Encounter: Payer: Self-pay | Admitting: Allergy and Immunology

## 2018-03-25 ENCOUNTER — Encounter: Payer: Self-pay | Admitting: Allergy and Immunology

## 2018-03-25 ENCOUNTER — Ambulatory Visit (INDEPENDENT_AMBULATORY_CARE_PROVIDER_SITE_OTHER): Payer: Medicare Other | Admitting: Allergy and Immunology

## 2018-03-25 VITALS — BP 124/72 | HR 72 | Resp 20

## 2018-03-25 DIAGNOSIS — J441 Chronic obstructive pulmonary disease with (acute) exacerbation: Secondary | ICD-10-CM | POA: Diagnosis not present

## 2018-03-25 DIAGNOSIS — K219 Gastro-esophageal reflux disease without esophagitis: Secondary | ICD-10-CM | POA: Diagnosis not present

## 2018-03-25 DIAGNOSIS — J3089 Other allergic rhinitis: Secondary | ICD-10-CM | POA: Diagnosis not present

## 2018-03-25 DIAGNOSIS — J45901 Unspecified asthma with (acute) exacerbation: Secondary | ICD-10-CM

## 2018-03-25 NOTE — Patient Instructions (Addendum)
   1. Continue Breo 200 one inhalation one time per day  2. Continue Spiriva Respimat 2 inhalations one time per day   3. Continue omeprazole 40 mg twice a day  4. Start prednisone 10mg  tablet one time per day for 10 days only  5. If needed:    A. ProAir HFA 2 puffs every 4-6 hours or albuterol nebulization  B. OTC antihistamine  C. OTC Mucinex DM  7. Return to clinic in 6 months or earlier if problem

## 2018-03-25 NOTE — Progress Notes (Signed)
Follow-up Note  Referring Provider: Shelbie Ammons, MD Primary Provider: Shelbie Ammons, MD Date of Office Visit: 03/25/2018  Subjective:   Tiffany Mcclure (DOB: May 30, 1955) is a 62 y.o. female who [facility] in re-evaluation of the following:  HPI: Tiffany Mcclure returns to this clinic in reevaluation of COPD with asthma, allergic rhinitis, and history of LPR.  Her last visit to this clinic was 17 Sep 2017.  During the interval she has done very well with her airway and has not required a systemic steroid or antibiotic to treat any type of respiratory tract issue.  Rarely does use a short acting bronchodilator and rarely did she use any nasal steroid while continuing to use Breo and Spiriva on a consistent basis.  As well, her reflux is under excellent control on omeprazole.  She no longer needs to use any topical nystatin solution to treat thrush while just brushing her teeth after using her inhalers at this point.  However, over the course of the past week she became stuffy and has had some coughing and wheezing and is somewhat short of breath and has been using her short acting bronchodilator.  There is no associated fever or anosmia or ugly nasal discharge or ugly sputum production or chest pain.  There is no obvious provoking factor giving rise to this increased airway activity.  She has not really been having a significant environmental exposure that is different and she has not started any new medications.  Allergies as of 03/25/2018      Reactions   Rubbing Alcohol [alcohol] Shortness Of Breath      Medication List      PROAIR HFA 108 (90 Base) MCG/ACT inhaler Generic drug:  albuterol Inhale 2 puffs into the lungs every 4 (four) hours as needed for wheezing or shortness of breath.   albuterol (2.5 MG/3ML) 0.083% nebulizer solution Commonly known as:  PROVENTIL Take 3 mLs (2.5 mg total) by nebulization every 4 (four) hours as  needed.   ALPRAZolam 0.5 MG tablet Commonly known as:  XANAX Take 0.5 mg by mouth. Take 1/2 to 1 tablet twice a day as needed for anxiety   Cetirizine HCl 10 MG Caps Take 1 capsule (10 mg total) by mouth daily as needed.   clopidogrel 75 MG tablet Commonly known as:  PLAVIX Take 75 mg by mouth daily.   DULoxetine 60 MG capsule Commonly known as:  CYMBALTA Take 60 mg by mouth daily.   EYE VITAMINS PO Take by mouth.   fluticasone furoate-vilanterol 200-25 MCG/INH Aepb Commonly known as:  BREO ELLIPTA Inhale 1 puff into the lungs daily. To prevent cough or wheeze   MOBIC PO Take 1 tablet by mouth daily.   multivitamin tablet Take 1 tablet by mouth daily.   omeprazole 40 MG capsule Commonly known as:  PRILOSEC Take one capsule twice daily as directed   oxyCODONE-acetaminophen 5-325 MG tablet Commonly known as:  PERCOCET/ROXICET Take 1 tablet by mouth. Take 1 tablet 3 times a day as needed   rosuvastatin 20 MG tablet Commonly known as:  CRESTOR Take 20 mg by mouth daily.   Tiotropium Bromide Monohydrate 1.25 MCG/ACT Aers Inhale 2 puffs into the lungs daily.   topiramate 200 MG tablet Commonly known as:  TOPAMAX Take 200 mg by mouth 2 (two) times daily.   zolpidem 10 MG tablet Commonly known as:  AMBIEN Take 5 mg by mouth at bedtime.  Past Medical History:  Diagnosis Date  . Asthma   . Insomnia   . Migraines   . Stroke [facility])     Past Surgical History:  Procedure Laterality Date  . APPENDECTOMY    . FOOT SURGERY     both  . VESICOVAGINAL FISTULA CLOSURE W/ TAH    . WISDOM TOOTH EXTRACTION      Review of systems negative except as noted in HPI / PMHx or noted below:  Review of Systems  Constitutional: Negative.   HENT: Negative.   Eyes: Negative.   Respiratory: Negative.   Cardiovascular: Negative.   Gastrointestinal: Negative.   Genitourinary: Negative.   Musculoskeletal: Negative.   Skin: Negative.   Neurological: Negative.    Endo/Heme/Allergies: Negative.   Psychiatric/Behavioral: Negative.      Objective:   Vitals:   03/25/18 0835  BP: 124/72  Pulse: 72  Resp: 20          Physical Exam  HENT:  Head: Normocephalic.  Right Ear: Tympanic membrane, external ear and ear canal normal.  Left Ear: Tympanic membrane, external ear and ear canal normal.  Nose: Mucosal edema present. No rhinorrhea.  Mouth/Throat: Uvula is midline, oropharynx is clear and moist and mucous membranes are normal. No oropharyngeal exudate.  Eyes: Conjunctivae are normal.  Neck: Trachea normal. No tracheal tenderness present. No tracheal deviation present. No thyromegaly present.  Cardiovascular: Normal rate, regular rhythm, S1 normal, S2 normal and normal heart sounds.  No murmur heard. Pulmonary/Chest: Breath sounds normal. No stridor. No respiratory distress. She has no wheezes. She has no rales.  Musculoskeletal: She exhibits no edema.  Lymphadenopathy:       Head (right side): No tonsillar adenopathy present.       Head (left side): No tonsillar adenopathy present.    She has no cervical adenopathy.  Neurological: She is alert.  Skin: No rash noted. She is not diaphoretic. No erythema. Nails show no clubbing.    Diagnostics:    Spirometry was performed and demonstrated an FEV1 of 1.66 at 72 % of predicted.  The patient had an Asthma Control Test with the following results: ACT Total Score: 14.    Assessment and Plan:   1. Asthma with COPD with exacerbation (HCC)   2. Other allergic rhinitis   3. LPRD (laryngopharyngeal reflux disease)     1. Continue Breo 200 one inhalation one time per day  2. Continue Spiriva Respimat 2 inhalations one time per day   3. Continue omeprazole 40 mg twice a day  4. Start prednisone 10mg  tablet one time per day for 10 days only  5. If needed:    A. ProAir HFA 2 puffs every 4-6 hours or albuterol nebulization  B. OTC antihistamine  C. OTC Mucinex DM  7. Return to clinic  in 6 months or earlier if problem   Tiffany Mcclure appears to have some inflammation of her airway which I will treat with a relatively low dose systemic steroid while she continues on anti-inflammatory agents for her airway as well as therapy directed against reflux.  Assuming she does well I will see her back in this clinic in 6 months or earlier if there is a problem.  [name], MD Allergy / Immunology [facility]

## 2018-03-29 ENCOUNTER — Encounter: Payer: Self-pay | Admitting: Allergy and Immunology

## 2018-04-05 ENCOUNTER — Ambulatory Visit (INDEPENDENT_AMBULATORY_CARE_PROVIDER_SITE_OTHER): Payer: Medicare Other | Admitting: Allergy and Immunology

## 2018-04-05 ENCOUNTER — Encounter: Payer: Self-pay | Admitting: Allergy and Immunology

## 2018-04-05 VITALS — BP 134/78 | HR 72 | Resp 24

## 2018-04-05 DIAGNOSIS — J45901 Unspecified asthma with (acute) exacerbation: Secondary | ICD-10-CM

## 2018-04-05 DIAGNOSIS — J441 Chronic obstructive pulmonary disease with (acute) exacerbation: Secondary | ICD-10-CM | POA: Diagnosis not present

## 2018-04-05 DIAGNOSIS — K219 Gastro-esophageal reflux disease without esophagitis: Secondary | ICD-10-CM

## 2018-04-05 DIAGNOSIS — J3089 Other allergic rhinitis: Secondary | ICD-10-CM | POA: Diagnosis not present

## 2018-04-05 MED ORDER — AMOXICILLIN-POT CLAVULANATE 875-125 MG PO TABS: ORAL_TABLET | ORAL | 0 refills | Status: AC

## 2018-04-05 NOTE — Patient Instructions (Signed)
   1. Continue Breo 200 one inhalation one time per day  2. Continue Spiriva Respimat 2 inhalations one time per day   3. Continue omeprazole 40 mg twice a day  4. If needed:    A. ProAir HFA 2 puffs every 4-6 hours or albuterol nebulization  B. OTC antihistamine  C. OTC Mucinex DM  5.  For this recent episode utilize the following:   A.  Augmentin 875 1 tablet twice a day for 10 days  B.  Prednisone 10 mg 1 tablet twice a day for 5 days  6. Return to clinic in 6 months or earlier if problem

## 2018-04-05 NOTE — Progress Notes (Signed)
Follow-up Note  Referring Provider: Shelbie AmmonsHaque, Imran P, MD Primary Provider: Shelbie AmmonsHaque, Imran P, MD Date of Office Visit: 04/05/2018  Subjective:   Tiffany Mcclure (DOB: 10/30/1955) is a 62 y.o. female who [facility]2018 in re-evaluation of the following:  HPI: Tiffany Mcclure returns to this clinic in reevaluation of COPD with asthma and allergic rhinitis and history of LPR.  I last saw her in this clinic on 25 March 2018 at which point in time she appeared to be undergoing a viral induced exacerbation of her respiratory tract inflammation for which she was given a low dose of systemic steroids.  She does not really think that she is much better.  She continues to have issues with being out of breath when she exerts herself.  She has a cough and she has nasal congestion.  She does not have any anosmia or ugly nasal discharge or fever or chest pain or sputum production or leg pain or leg swelling.  She has been using her short acting bronchodilator daily while she continues on a collection of anti-inflammatory agents.  Allergies as of 04/05/2018      Reactions   Rubbing Alcohol [alcohol] Shortness Of Breath      Medication List      PROAIR HFA 108 (90 Base) MCG/ACT inhaler Generic drug:  albuterol Inhale 2 puffs into the lungs every 4 (four) hours as needed for wheezing or shortness of breath.   albuterol (2.5 MG/3ML) 0.083% nebulizer solution Commonly known as:  PROVENTIL Take 3 mLs (2.5 mg total) by nebulization every 4 (four) hours as needed.   ALPRAZolam 0.5 MG tablet Commonly known as:  XANAX Take 0.5 mg by mouth. Take 1/2 to 1 tablet twice a day as needed for anxiety   Cetirizine HCl 10 MG Caps Take 1 capsule (10 mg total) by mouth daily as needed.   clopidogrel 75 MG tablet Commonly known as:  PLAVIX Take 75 mg by mouth daily.   DULoxetine 60 MG capsule Commonly known as:  CYMBALTA Take 60 mg by mouth daily.   EYE VITAMINS  PO Take by mouth.   fluticasone furoate-vilanterol 200-25 MCG/INH Aepb Commonly known as:  BREO ELLIPTA Inhale 1 puff into the lungs daily. To prevent cough or wheeze   MOBIC PO Take 1 tablet by mouth daily.   multivitamin tablet Take 1 tablet by mouth daily.   omeprazole 40 MG capsule Commonly known as:  PRILOSEC Take one capsule twice daily as directed   oxyCODONE-acetaminophen 5-325 MG tablet Commonly known as:  PERCOCET/ROXICET Take 1 tablet by mouth. Take 1 tablet 3 times a day as needed   rosuvastatin 20 MG tablet Commonly known as:  CRESTOR Take 20 mg by mouth daily.   Tiotropium Bromide Monohydrate 1.25 MCG/ACT Aers Inhale 2 puffs into the lungs daily.   topiramate 200 MG tablet Commonly known as:  TOPAMAX Take 200 mg by mouth 2 (two) times daily.   zolpidem 10 MG tablet Commonly known as:  AMBIEN Take 5 mg by mouth at bedtime.       Past Medical History:  Diagnosis Date  . Asthma   . Insomnia   . Migraines   . Stroke [facility](HCC)     Past Surgical History:  Procedure Laterality Date  . APPENDECTOMY    . FOOT SURGERY     both  . VESICOVAGINAL FISTULA CLOSURE W/ TAH    . WISDOM TOOTH EXTRACTION  Review of systems negative except as noted in HPI / PMHx or noted below:  Review of Systems  Constitutional: Negative.   HENT: Negative.   Eyes: Negative.   Respiratory: Negative.   Cardiovascular: Negative.   Gastrointestinal: Negative.   Genitourinary: Negative.   Musculoskeletal: Negative.   Skin: Negative.   Neurological: Negative.   Endo/Heme/Allergies: Negative.   Psychiatric/Behavioral: Negative.      Objective:   Vitals:   04/05/18 0949  BP: 134/78  Pulse: 72  Resp: (!) 24          Physical Exam  Constitutional:  Nasal voice  HENT:  Head: Normocephalic.  Right Ear: Tympanic membrane, external ear and ear canal normal.  Left Ear: Tympanic membrane, external ear and ear canal normal.  Nose: Nose normal. No mucosal edema or  rhinorrhea.  Mouth/Throat: Uvula is midline, oropharynx is clear and moist and mucous membranes are normal. No oropharyngeal exudate.  Eyes: Conjunctivae are normal.  Neck: Trachea normal. No tracheal tenderness present. No tracheal deviation present. No thyromegaly present.  Cardiovascular: Normal rate, regular rhythm, S1 normal, S2 normal and normal heart sounds.  No murmur heard. Pulmonary/Chest: Breath sounds normal. No stridor. No respiratory distress. She has no wheezes. She has no rales.  Musculoskeletal: She exhibits no edema.  Lymphadenopathy:       Head (right side): No tonsillar adenopathy present.       Head (left side): No tonsillar adenopathy present.    She has no cervical adenopathy.  Neurological: She is alert.  Skin: No rash noted. She is not diaphoretic. No erythema. Nails show no clubbing.    Diagnostics:    Spirometry was performed and demonstrated an FEV1 of 1.46 at 63 % of predicted.  Assessment and Plan:   1. Asthma with COPD with exacerbation (HCC)   2. Other allergic rhinitis   3. LPRD (laryngopharyngeal reflux disease)     1. Continue Breo 200 one inhalation one time per day  2. Continue Spiriva Respimat 2 inhalations one time per day   3. Continue omeprazole 40 mg twice a day  4. If needed:    A. ProAir HFA 2 puffs every 4-6 hours or albuterol nebulization  B. OTC antihistamine  C. OTC Mucinex DM  5.  For this recent episode utilize the following:   A.  Augmentin 875 1 tablet twice a day for 10 days  B.  Prednisone 10 mg 1 tablet twice a day for 5 days  6. Return to clinic in 6 months or earlier if problem   Tiffany Mcclure appears to have some significant inflammation of her airway and she has not responded well to standard therapy directed against inflammation and we will now increase her therapy to cover a bacterial source of her symptoms while once again utilizing a systemic steroid.  She will keep in contact with me noting her response to this  approach.  Further evaluation treatment based upon her response.  [name], MD [facility]ergy [facility]ology  [facility]

## 2018-04-06 ENCOUNTER — Encounter: Payer: Self-pay | Admitting: Allergy and Immunology

## 2018-07-26 ENCOUNTER — Ambulatory Visit: Payer: Medicare Other | Admitting: Allergy and Immunology

## 2018-09-21 ENCOUNTER — Ambulatory Visit: Payer: Medicaid Other | Admitting: Allergy

## 2018-09-24 ENCOUNTER — Ambulatory Visit: Payer: Medicare Other | Admitting: Family Medicine
